# Patient Record
Sex: Female | Born: 1980 | Race: White | Hispanic: No | Marital: Married | Smoking: Never smoker
Health system: Southern US, Community
[De-identification: ages and names within clinical notes are randomized; demographics above are authoritative.]

## PROBLEM LIST (undated history)

## (undated) DIAGNOSIS — Z800 Family history of malignant neoplasm of digestive organs: Secondary | ICD-10-CM

## (undated) DIAGNOSIS — K620 Anal polyp: Secondary | ICD-10-CM

## (undated) DIAGNOSIS — Z8719 Personal history of other diseases of the digestive system: Secondary | ICD-10-CM

## (undated) DIAGNOSIS — B009 Herpesviral infection, unspecified: Secondary | ICD-10-CM

## (undated) DIAGNOSIS — E039 Hypothyroidism, unspecified: Secondary | ICD-10-CM

## (undated) DIAGNOSIS — K219 Gastro-esophageal reflux disease without esophagitis: Secondary | ICD-10-CM

## (undated) HISTORY — PX: COLONOSCOPY: SHX174

## (undated) HISTORY — DX: Hypothyroidism, unspecified: E03.9

## (undated) SURGERY — Surgical Case
Anesthesia: *Unknown

---

## 2002-09-16 HISTORY — PX: WISDOM TOOTH EXTRACTION: SHX21

## 2011-05-10 LAB — ANTIBODY SCREEN: Antibody Screen: NEGATIVE

## 2011-05-10 LAB — ABO/RH: RH Type: POSITIVE

## 2011-05-10 LAB — RUBELLA ANTIBODY, IGM: Rubella: IMMUNE

## 2011-05-10 LAB — CBC
HCT: 33 % — AB (ref 36–46)
Hemoglobin: 11.9 g/dL — AB (ref 12.0–16.0)
Platelets: 352 10*3/uL (ref 150–399)

## 2011-05-10 LAB — HIV ANTIBODY (ROUTINE TESTING W REFLEX): HIV: NONREACTIVE

## 2011-05-31 LAB — GC/CHLAMYDIA PROBE AMP, GENITAL: Chlamydia: NEGATIVE

## 2011-09-15 ENCOUNTER — Inpatient Hospital Stay (HOSPITAL_COMMUNITY): Admission: AD | Admit: 2011-09-15 | Payer: Self-pay | Source: Ambulatory Visit | Admitting: Obstetrics

## 2011-12-05 ENCOUNTER — Inpatient Hospital Stay (HOSPITAL_COMMUNITY)
Admission: AD | Admit: 2011-12-05 | Discharge: 2011-12-08 | DRG: 370 | Disposition: A | Discharge status: Home / Self Care | Payer: Federal, State, Local not specified - PPO | Source: Ambulatory Visit | Attending: Obstetrics | Admitting: Obstetrics

## 2011-12-05 ENCOUNTER — Encounter (HOSPITAL_COMMUNITY): Payer: Self-pay | Admitting: *Deleted

## 2011-12-05 DIAGNOSIS — O9903 Anemia complicating the puerperium: Secondary | ICD-10-CM | POA: Diagnosis not present

## 2011-12-05 DIAGNOSIS — O99892 Other specified diseases and conditions complicating childbirth: Secondary | ICD-10-CM | POA: Diagnosis present

## 2011-12-05 DIAGNOSIS — D62 Acute posthemorrhagic anemia: Secondary | ICD-10-CM | POA: Diagnosis not present

## 2011-12-05 DIAGNOSIS — O324XX0 Maternal care for high head at term, not applicable or unspecified: Secondary | ICD-10-CM | POA: Diagnosis present

## 2011-12-05 DIAGNOSIS — Z22330 Carrier of Group B streptococcus: Secondary | ICD-10-CM

## 2011-12-05 LAB — CBC
Hemoglobin: 11.6 g/dL — ABNORMAL LOW (ref 12.0–15.0)
MCH: 30.3 pg (ref 26.0–34.0)
Platelets: 277 10*3/uL (ref 150–400)
RBC: 3.83 MIL/uL — ABNORMAL LOW (ref 3.87–5.11)
WBC: 11.2 10*3/uL — ABNORMAL HIGH (ref 4.0–10.5)

## 2011-12-05 LAB — POCT FERN TEST: Fern Test: POSITIVE

## 2011-12-05 MED ORDER — PHENYLEPHRINE 40 MCG/ML (10ML) SYRINGE FOR IV PUSH (FOR BLOOD PRESSURE SUPPORT)
80.0000 ug | PREFILLED_SYRINGE | INTRAVENOUS | Status: DC | PRN
  Filled 2011-12-05: qty 5

## 2011-12-05 MED ORDER — EPHEDRINE 5 MG/ML INJ: 10.0000 mg | INTRAVENOUS | Status: DC | PRN

## 2011-12-05 MED ORDER — PENICILLIN G POTASSIUM 5000000 UNITS IJ SOLR
2.5000 10*6.[IU] | INTRAVENOUS | Status: DC
  Administered 2011-12-06 (×2): 2.5 10*6.[IU] via INTRAVENOUS
  Filled 2011-12-05 (×4): qty 2.5

## 2011-12-05 MED ORDER — EPHEDRINE 5 MG/ML INJ
10.0000 mg | INTRAVENOUS | Status: DC | PRN
  Filled 2011-12-05: qty 4

## 2011-12-05 MED ORDER — ONDANSETRON HCL 4 MG/2ML IJ SOLN: 4.0000 mg | Freq: Four times a day (QID) | INTRAMUSCULAR | Status: DC | PRN

## 2011-12-05 MED ORDER — OXYCODONE-ACETAMINOPHEN 5-325 MG PO TABS: 1.0000 | ORAL_TABLET | ORAL | Status: DC | PRN

## 2011-12-05 MED ORDER — IBUPROFEN 600 MG PO TABS: 600.0000 mg | ORAL_TABLET | Freq: Four times a day (QID) | ORAL | Status: DC | PRN

## 2011-12-05 MED ORDER — OXYTOCIN BOLUS FROM INFUSION
500.0000 mL | Freq: Once | INTRAVENOUS | Status: DC
  Filled 2011-12-05: qty 500
  Filled 2011-12-05: qty 1000

## 2011-12-05 MED ORDER — LACTATED RINGERS IV SOLN
500.0000 mL | Freq: Once | INTRAVENOUS | Status: AC
  Administered 2011-12-06 (×2): via INTRAVENOUS
  Administered 2011-12-06: 500 mL via INTRAVENOUS

## 2011-12-05 MED ORDER — OXYTOCIN 20 UNITS IN LACTATED RINGERS INFUSION - SIMPLE: 125.0000 mL/h | Freq: Once | INTRAVENOUS | Status: DC

## 2011-12-05 MED ORDER — FENTANYL 2.5 MCG/ML BUPIVACAINE 1/10 % EPIDURAL INFUSION (WH - ANES)
14.0000 mL/h | INTRAMUSCULAR | Status: DC
  Administered 2011-12-06 (×3): 14 mL/h via EPIDURAL
  Filled 2011-12-05 (×3): qty 60

## 2011-12-05 MED ORDER — ACETAMINOPHEN 325 MG PO TABS: 650.0000 mg | ORAL_TABLET | ORAL | Status: DC | PRN

## 2011-12-05 MED ORDER — PHENYLEPHRINE 40 MCG/ML (10ML) SYRINGE FOR IV PUSH (FOR BLOOD PRESSURE SUPPORT): 80.0000 ug | PREFILLED_SYRINGE | INTRAVENOUS | Status: DC | PRN

## 2011-12-05 MED ORDER — LACTATED RINGERS IV SOLN: 500.0000 mL | INTRAVENOUS | Status: DC | PRN

## 2011-12-05 MED ORDER — PENICILLIN G POTASSIUM 5000000 UNITS IJ SOLR
5.0000 10*6.[IU] | Freq: Once | INTRAVENOUS | Status: AC
  Administered 2011-12-05: 5 10*6.[IU] via INTRAVENOUS
  Filled 2011-12-05: qty 5

## 2011-12-05 MED ORDER — LACTATED RINGERS IV SOLN
INTRAVENOUS | Status: DC
  Administered 2011-12-05 – 2011-12-06 (×3): via INTRAVENOUS

## 2011-12-05 MED ORDER — DIPHENHYDRAMINE HCL 50 MG/ML IJ SOLN: 12.5000 mg | INTRAMUSCULAR | Status: DC | PRN

## 2011-12-05 MED ORDER — CITRIC ACID-SODIUM CITRATE 334-500 MG/5ML PO SOLN
30.0000 mL | ORAL | Status: DC | PRN
  Administered 2011-12-06: 30 mL via ORAL
  Filled 2011-12-05: qty 15

## 2011-12-05 MED ORDER — FLEET ENEMA 7-19 GM/118ML RE ENEM: 1.0000 | ENEMA | RECTAL | Status: DC | PRN

## 2011-12-05 MED ORDER — LIDOCAINE HCL (PF) 1 % IJ SOLN
30.0000 mL | INTRAMUSCULAR | Status: DC | PRN
  Filled 2011-12-05: qty 30

## 2011-12-05 NOTE — Progress Notes (Deleted)
Pt appears distracted by the telephone. Pt instructed to keep appointment already scheduled. Pt denies pain at this time.

## 2011-12-05 NOTE — MAU Note (Signed)
Pt G1 at 38.4wks, gush of clear fluid at 2030.  GBS +.  Pt seen in the office 3/20 SVE 4cm.

## 2011-12-05 NOTE — Progress Notes (Signed)
Dr. Ernestina Penna notified patient SROM at 2030 clear fluid. SVE 4/80/-1 ctx every 4-5 minutes apart. GBS pos NKDA. Orders to admit with Penicillin for GBS. Epidural prn

## 2011-12-06 ENCOUNTER — Encounter (HOSPITAL_COMMUNITY)
Admission: AD | Disposition: A | Discharge status: Home / Self Care | Payer: Self-pay | Source: Ambulatory Visit | Attending: Obstetrics

## 2011-12-06 ENCOUNTER — Encounter (HOSPITAL_COMMUNITY): Payer: Self-pay | Admitting: *Deleted

## 2011-12-06 ENCOUNTER — Encounter (HOSPITAL_COMMUNITY): Payer: Self-pay | Admitting: Anesthesiology

## 2011-12-06 ENCOUNTER — Inpatient Hospital Stay (HOSPITAL_COMMUNITY): Payer: Federal, State, Local not specified - PPO | Admitting: Anesthesiology

## 2011-12-06 LAB — ABO/RH: ABO/RH(D): O POS

## 2011-12-06 LAB — RPR: RPR Ser Ql: NONREACTIVE

## 2011-12-06 SURGERY — Surgical Case
Anesthesia: Epidural

## 2011-12-06 MED ORDER — CEFAZOLIN SODIUM 1-5 GM-% IV SOLN
INTRAVENOUS | Status: AC
  Filled 2011-12-06: qty 50

## 2011-12-06 MED ORDER — DIBUCAINE 1 % RE OINT: 1.0000 "application " | TOPICAL_OINTMENT | RECTAL | Status: DC | PRN

## 2011-12-06 MED ORDER — FENTANYL CITRATE 0.05 MG/ML IJ SOLN
25.0000 ug | INTRAMUSCULAR | Status: DC | PRN
  Administered 2011-12-06: 50 ug via INTRAVENOUS

## 2011-12-06 MED ORDER — CEFAZOLIN SODIUM 1-5 GM-% IV SOLN
INTRAVENOUS | Status: DC | PRN
  Administered 2011-12-06: 1 g via INTRAVENOUS

## 2011-12-06 MED ORDER — KETOROLAC TROMETHAMINE 30 MG/ML IJ SOLN: 30.0000 mg | Freq: Four times a day (QID) | INTRAMUSCULAR | Status: AC | PRN

## 2011-12-06 MED ORDER — IBUPROFEN 600 MG PO TABS: 600.0000 mg | ORAL_TABLET | Freq: Four times a day (QID) | ORAL | Status: DC | PRN

## 2011-12-06 MED ORDER — MEPERIDINE HCL 25 MG/ML IJ SOLN
INTRAMUSCULAR | Status: AC
  Filled 2011-12-06: qty 1

## 2011-12-06 MED ORDER — SODIUM BICARBONATE 8.4 % IV SOLN
INTRAVENOUS | Status: DC | PRN
  Administered 2011-12-06: 5 mL via EPIDURAL

## 2011-12-06 MED ORDER — TERBUTALINE SULFATE 1 MG/ML IJ SOLN: 0.2500 mg | Freq: Once | INTRAMUSCULAR | Status: DC | PRN

## 2011-12-06 MED ORDER — ZOLPIDEM TARTRATE 5 MG PO TABS: 5.0000 mg | ORAL_TABLET | Freq: Every evening | ORAL | Status: DC | PRN

## 2011-12-06 MED ORDER — FENTANYL CITRATE 0.05 MG/ML IJ SOLN
INTRAMUSCULAR | Status: AC
  Administered 2011-12-06: 50 ug via INTRAVENOUS
  Filled 2011-12-06: qty 2

## 2011-12-06 MED ORDER — OXYTOCIN 10 UNIT/ML IJ SOLN
INTRAMUSCULAR | Status: DC | PRN
  Administered 2011-12-06: 20 [IU]
  Administered 2011-12-06: 20 [IU] via INTRAMUSCULAR

## 2011-12-06 MED ORDER — MEPERIDINE HCL 25 MG/ML IJ SOLN
INTRAMUSCULAR | Status: DC | PRN
  Administered 2011-12-06 (×2): 12.5 mg via INTRAVENOUS

## 2011-12-06 MED ORDER — DIPHENHYDRAMINE HCL 25 MG PO CAPS
25.0000 mg | ORAL_CAPSULE | ORAL | Status: DC | PRN
  Administered 2011-12-06: 25 mg via ORAL
  Filled 2011-12-06: qty 1

## 2011-12-06 MED ORDER — LACTATED RINGERS IV SOLN: INTRAVENOUS | Status: DC

## 2011-12-06 MED ORDER — LANOLIN HYDROUS EX OINT: 1.0000 "application " | TOPICAL_OINTMENT | CUTANEOUS | Status: DC | PRN

## 2011-12-06 MED ORDER — MEPERIDINE HCL 25 MG/ML IJ SOLN: 6.2500 mg | INTRAMUSCULAR | Status: DC | PRN

## 2011-12-06 MED ORDER — ONDANSETRON HCL 4 MG/2ML IJ SOLN
INTRAMUSCULAR | Status: DC | PRN
  Administered 2011-12-06: 4 mg via INTRAVENOUS

## 2011-12-06 MED ORDER — SCOPOLAMINE 1 MG/3DAYS TD PT72
1.0000 | MEDICATED_PATCH | Freq: Once | TRANSDERMAL | Status: DC
  Filled 2011-12-06: qty 1

## 2011-12-06 MED ORDER — NALBUPHINE HCL 10 MG/ML IJ SOLN
5.0000 mg | INTRAMUSCULAR | Status: DC | PRN
  Filled 2011-12-06: qty 1

## 2011-12-06 MED ORDER — LIDOCAINE-EPINEPHRINE (PF) 2 %-1:200000 IJ SOLN
INTRAMUSCULAR | Status: AC
  Filled 2011-12-06: qty 20

## 2011-12-06 MED ORDER — SODIUM CHLORIDE 0.9 % IJ SOLN: 3.0000 mL | INTRAMUSCULAR | Status: DC | PRN

## 2011-12-06 MED ORDER — MORPHINE SULFATE 0.5 MG/ML IJ SOLN
INTRAMUSCULAR | Status: AC
  Filled 2011-12-06: qty 10

## 2011-12-06 MED ORDER — OXYTOCIN 20 UNITS IN LACTATED RINGERS INFUSION - SIMPLE
1.0000 m[IU]/min | INTRAVENOUS | Status: DC
  Administered 2011-12-06: 2 m[IU]/min via INTRAVENOUS

## 2011-12-06 MED ORDER — SIMETHICONE 80 MG PO CHEW
80.0000 mg | CHEWABLE_TABLET | Freq: Three times a day (TID) | ORAL | Status: DC
  Administered 2011-12-06 – 2011-12-08 (×8): 80 mg via ORAL

## 2011-12-06 MED ORDER — ONDANSETRON HCL 4 MG/2ML IJ SOLN: 4.0000 mg | INTRAMUSCULAR | Status: DC | PRN

## 2011-12-06 MED ORDER — OXYTOCIN 10 UNIT/ML IJ SOLN
INTRAMUSCULAR | Status: AC
  Filled 2011-12-06: qty 4

## 2011-12-06 MED ORDER — TETANUS-DIPHTH-ACELL PERTUSSIS 5-2.5-18.5 LF-MCG/0.5 IM SUSP: 0.5000 mL | Freq: Once | INTRAMUSCULAR | Status: DC

## 2011-12-06 MED ORDER — KETOROLAC TROMETHAMINE 30 MG/ML IJ SOLN
INTRAMUSCULAR | Status: AC
  Administered 2011-12-06: 30 mg via INTRAVENOUS
  Filled 2011-12-06: qty 1

## 2011-12-06 MED ORDER — MENTHOL 3 MG MT LOZG: 1.0000 | LOZENGE | OROMUCOSAL | Status: DC | PRN

## 2011-12-06 MED ORDER — OXYTOCIN 20 UNITS IN LACTATED RINGERS INFUSION - SIMPLE: 125.0000 mL/h | INTRAVENOUS | Status: AC

## 2011-12-06 MED ORDER — ONDANSETRON HCL 4 MG PO TABS: 4.0000 mg | ORAL_TABLET | ORAL | Status: DC | PRN

## 2011-12-06 MED ORDER — DIPHENHYDRAMINE HCL 50 MG/ML IJ SOLN: 12.5000 mg | INTRAMUSCULAR | Status: DC | PRN

## 2011-12-06 MED ORDER — IBUPROFEN 600 MG PO TABS
600.0000 mg | ORAL_TABLET | Freq: Four times a day (QID) | ORAL | Status: DC
  Administered 2011-12-06 – 2011-12-08 (×8): 600 mg via ORAL
  Filled 2011-12-06 (×8): qty 1

## 2011-12-06 MED ORDER — ONDANSETRON HCL 4 MG/2ML IJ SOLN: 4.0000 mg | Freq: Three times a day (TID) | INTRAMUSCULAR | Status: DC | PRN

## 2011-12-06 MED ORDER — SIMETHICONE 80 MG PO CHEW: 80.0000 mg | CHEWABLE_TABLET | ORAL | Status: DC | PRN

## 2011-12-06 MED ORDER — METOCLOPRAMIDE HCL 5 MG/ML IJ SOLN: 10.0000 mg | Freq: Once | INTRAMUSCULAR | Status: DC | PRN

## 2011-12-06 MED ORDER — WITCH HAZEL-GLYCERIN EX PADS: 1.0000 "application " | MEDICATED_PAD | CUTANEOUS | Status: DC | PRN

## 2011-12-06 MED ORDER — DIPHENHYDRAMINE HCL 25 MG PO CAPS: 25.0000 mg | ORAL_CAPSULE | Freq: Four times a day (QID) | ORAL | Status: DC | PRN

## 2011-12-06 MED ORDER — OXYCODONE-ACETAMINOPHEN 5-325 MG PO TABS: 1.0000 | ORAL_TABLET | ORAL | Status: DC | PRN

## 2011-12-06 MED ORDER — SODIUM CHLORIDE 0.9 % IV SOLN: 1.0000 ug/kg/h | INTRAVENOUS | Status: DC | PRN

## 2011-12-06 MED ORDER — PRENATAL MULTIVITAMIN CH
1.0000 | ORAL_TABLET | Freq: Every day | ORAL | Status: DC
  Administered 2011-12-07 – 2011-12-08 (×2): 1 via ORAL
  Filled 2011-12-06 (×2): qty 1

## 2011-12-06 MED ORDER — ONDANSETRON HCL 4 MG/2ML IJ SOLN
INTRAMUSCULAR | Status: AC
  Filled 2011-12-06: qty 2

## 2011-12-06 MED ORDER — KETOROLAC TROMETHAMINE 30 MG/ML IJ SOLN
30.0000 mg | Freq: Four times a day (QID) | INTRAMUSCULAR | Status: AC | PRN
  Administered 2011-12-06: 30 mg via INTRAVENOUS

## 2011-12-06 MED ORDER — NALOXONE HCL 0.4 MG/ML IJ SOLN: 0.4000 mg | INTRAMUSCULAR | Status: DC | PRN

## 2011-12-06 MED ORDER — SODIUM BICARBONATE 8.4 % IV SOLN
INTRAVENOUS | Status: AC
  Filled 2011-12-06: qty 50

## 2011-12-06 MED ORDER — LIDOCAINE HCL (PF) 1 % IJ SOLN
INTRAMUSCULAR | Status: DC | PRN
  Administered 2011-12-06 (×3): 4 mL

## 2011-12-06 MED ORDER — DIPHENHYDRAMINE HCL 50 MG/ML IJ SOLN: 25.0000 mg | INTRAMUSCULAR | Status: DC | PRN

## 2011-12-06 MED ORDER — MORPHINE SULFATE (PF) 0.5 MG/ML IJ SOLN
INTRAMUSCULAR | Status: DC | PRN
  Administered 2011-12-06: 1 mg via INTRAVENOUS
  Administered 2011-12-06: 4 mg via EPIDURAL

## 2011-12-06 MED ORDER — SENNOSIDES-DOCUSATE SODIUM 8.6-50 MG PO TABS
2.0000 | ORAL_TABLET | Freq: Every day | ORAL | Status: DC
  Administered 2011-12-06 – 2011-12-08 (×2): 2 via ORAL

## 2011-12-06 MED ORDER — METOCLOPRAMIDE HCL 5 MG/ML IJ SOLN: 10.0000 mg | Freq: Three times a day (TID) | INTRAMUSCULAR | Status: DC | PRN

## 2011-12-06 SURGICAL SUPPLY — 29 items
CHLORAPREP W/TINT 26ML (MISCELLANEOUS) ×2 IMPLANT
CLOTH BEACON ORANGE TIMEOUT ST (SAFETY) ×2 IMPLANT
CONTAINER PREFILL 10% NBF 15ML (MISCELLANEOUS) IMPLANT
ELECT REM PT RETURN 9FT ADLT (ELECTROSURGICAL) ×2
ELECTRODE REM PT RTRN 9FT ADLT (ELECTROSURGICAL) ×1 IMPLANT
EXTRACTOR VACUUM KIWI (MISCELLANEOUS) IMPLANT
EXTRACTOR VACUUM M CUP 4 TUBE (SUCTIONS) IMPLANT
GLOVE BIO SURGEON STRL SZ 6.5 (GLOVE) ×2 IMPLANT
GLOVE BIOGEL PI IND STRL 7.0 (GLOVE) ×2 IMPLANT
GLOVE BIOGEL PI INDICATOR 7.0 (GLOVE) ×2
GOWN PREVENTION PLUS LG XLONG (DISPOSABLE) ×6 IMPLANT
KIT ABG SYR 3ML LUER SLIP (SYRINGE) IMPLANT
NEEDLE HYPO 25X5/8 SAFETYGLIDE (NEEDLE) IMPLANT
NS IRRIG 1000ML POUR BTL (IV SOLUTION) ×2 IMPLANT
PACK C SECTION WH (CUSTOM PROCEDURE TRAY) ×2 IMPLANT
SLEEVE SCD COMPRESS KNEE MED (MISCELLANEOUS) IMPLANT
STAPLER VISISTAT 35W (STAPLE) IMPLANT
STRIP CLOSURE SKIN 1/2X4 (GAUZE/BANDAGES/DRESSINGS) IMPLANT
SUT MON AB 4-0 PS1 27 (SUTURE) IMPLANT
SUT PLAIN 0 NONE (SUTURE) IMPLANT
SUT PLAIN 2 0 XLH (SUTURE) IMPLANT
SUT VIC AB 0 CT1 36 (SUTURE) ×4 IMPLANT
SUT VIC AB 0 CTX 36 (SUTURE) ×3
SUT VIC AB 0 CTX36XBRD ANBCTRL (SUTURE) ×3 IMPLANT
SUT VIC AB 2-0 CT1 27 (SUTURE) ×1
SUT VIC AB 2-0 CT1 TAPERPNT 27 (SUTURE) ×1 IMPLANT
TOWEL OR 17X24 6PK STRL BLUE (TOWEL DISPOSABLE) ×4 IMPLANT
TRAY FOLEY CATH 14FR (SET/KITS/TRAYS/PACK) IMPLANT
WATER STERILE IRR 1000ML POUR (IV SOLUTION) ×2 IMPLANT

## 2011-12-06 NOTE — Transfer of Care (Signed)
Immediate Anesthesia Transfer of Care Note  Patient: Molly Castro  Procedure(s) Performed: Procedure(s) (LRB): CESAREAN SECTION (N/A)  Patient Location: PACU  Anesthesia Type: Epidural  Level of Consciousness: awake, alert  and oriented  Airway & Oxygen Therapy: Patient Spontanous Breathing  Post-op Assessment: Report given to PACU RN  Post vital signs: stable  Complications: No apparent anesthesia complications

## 2011-12-06 NOTE — H&P (Signed)
Molly Castro is a 31 y.o. G1P0 at [redacted]w[redacted]d presenting for SROM. Pt notes no contractions until after admission. Started feeling ctx about MN and had now received epidural . Good fetal movement, No vaginal bleeding, started leaking fluid about 8pm.  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 No pertinent past medical history    Past Surgical History  Procedure Date  . Wisdom tooth extraction 2004   Family History: family history is not on file. Social History:  reports that she has never smoked. She has never used smokeless tobacco. She reports that she does not drink alcohol. Her drug history not on file.  Review of Systems - Negative except ROM, ctx   Dilation: 5 Effacement (%): 100 Station: 0 Exam by:: Ernestina Penna, MD Blood pressure 131/81, pulse 78, temperature 98.2 F (36.8 C), temperature source Oral, resp. rate 20, height 5\' 5"  (1.651 m), weight 69.037 kg (152 lb 3.2 oz), SpO2 97.00%.  Physical Exam:  Gen: well appearing, no distress Back: no CVAT Abd: gravid, NT, no RUQ pain EFW 7# LE: no edema, equal bilaterally, non-tender Toco: q 2-4 min, coupling noted FH: baseline 150s, accelerations present, no deceleratons, 10 beat variability  Prenatal labs: ABO, Rh: O/Positive/-- (08/24 0000) Antibody: Negative (08/24 0000) Rubella:  immune RPR: Nonreactive (08/24 0000)  HBsAg: Negative (08/24 0000)  HIV: Non-reactive (08/24 0000)  GBS: Positive (03/06 0000)  1 hr Glucola 112  Genetic screening nl NT, nl AFP Anatomy US EIF x 2   Assessment/Plan: 31 y.o. G1P0 at [redacted]w[redacted]d - Now in active labor following SROM. Will cont to manage expectantly, start pitocin if cvx does not cont to change - GBS pos.  PCN  - Reactive fetal testing   Maxximus Gotay A. 12/06/2011, 1:00 AM

## 2011-12-06 NOTE — Progress Notes (Signed)
Patient positioned for pushing. FOB offered stationary chair for pushing/delivery.

## 2011-12-06 NOTE — Progress Notes (Signed)
S: Doing well, no complaints, pain controlled by epidural though able to feel ctx and also constant rectal pressure. Now pushing 3.5 hrs  O: BP 139/79  Pulse 118  Temp(Src) 98.5 F (36.9 C) (Oral)  Resp 20  Ht 5\' 5"  (1.651 m)  Wt 69.037 kg (152 lb 3.2 oz)  BMI 25.33 kg/m2  SpO2 98%   FHT:  FHR: 150 bpm, variability: moderate,  accelerations:  Present,  decelerations:  Absent UC:   Coupling q 3-5 min, pitocin started 30 min ago SVE:   Dilation: 10 Effacement (%): 100 Station: +3 Exam by:: Isac Sarna, RN Has been fully dilated since 4:30 am, pushed 3.5 hr w/ no significant descent despite pushing, increase molding/ caput. asynclitic  A / P:  31 y.o.  Obstetric History   G1   P0   T0   P0   A0   TAB0   SAB0   E0   M0   L0    at [redacted]w[redacted]d Arrest of decent  Fetal Wellbeing:  Category I Pain Control:  Epidural  Anticipated MOD:  c/s now. R/B d/w pt about c/s vs continued expectant management. pt agrees to c/s, aware of increased risks given baby so low in pelvis which increases bleeding risks.   Molly Castro A. 12/06/2011, 8:44 AM

## 2011-12-06 NOTE — Anesthesia Postprocedure Evaluation (Signed)
Anesthesia Post Note  Patient: Molly Castro  Procedure(s) Performed: Procedure(s) (LRB): CESAREAN SECTION (N/A)  Anesthesia type: Epidural  Patient location: PACU  Post pain: Pain level controlled  Post assessment: Post-op Vital signs reviewed  Last Vitals:  Filed Vitals:   12/06/11 1030  BP: 122/75  Pulse: 77  Temp:   Resp: 18    Post vital signs: Reviewed  Level of consciousness: awake  Complications: No apparent anesthesia complications

## 2011-12-06 NOTE — Progress Notes (Signed)
UR chart review completed.  

## 2011-12-06 NOTE — Anesthesia Preprocedure Evaluation (Addendum)
Anesthesia Evaluation  Patient identified by MRN, date of birth, ID band Patient awake    Reviewed: Allergy & Precautions, H&P , NPO status , Patient's Chart, lab work & pertinent test results, reviewed documented beta blocker date and time   History of Anesthesia Complications Negative for: history of anesthetic complications  Airway Mallampati: III TM Distance: >3 FB Neck ROM: full    Dental  (+) Teeth Intact   Pulmonary neg pulmonary ROS,  breath sounds clear to auscultation        Cardiovascular negative cardio ROS  Rhythm:regular Rate:Normal     Neuro/Psych negative neurological ROS  negative psych ROS   GI/Hepatic negative GI ROS, Neg liver ROS,   Endo/Other  negative endocrine ROS  Renal/GU negative Renal ROS     Musculoskeletal   Abdominal   Peds  Hematology negative hematology ROS (+)   Anesthesia Other Findings   Reproductive/Obstetrics (+) Pregnancy                           Anesthesia Physical Anesthesia Plan  ASA: II and Emergent  Anesthesia Plan: Epidural   Post-op Pain Management:    Induction:   Airway Management Planned:   Additional Equipment:   Intra-op Plan:   Post-operative Plan:   Informed Consent: I have reviewed the patients History and Physical, chart, labs and discussed the procedure including the risks, benefits and alternatives for the proposed anesthesia with the patient or authorized representative who has indicated his/her understanding and acceptance.     Plan Discussed with: CRNA, Anesthesiologist and Surgeon  Anesthesia Plan Comments:        Anesthesia Quick Evaluation

## 2011-12-06 NOTE — Progress Notes (Signed)
Rodman Pickle, MD, at bedside for epidural placement. Father of baby offered a stationary chair during procedure. Father of baby declines.

## 2011-12-06 NOTE — Anesthesia Procedure Notes (Signed)
Epidural Patient location during procedure: OB Start time: 12/06/2011 12:28 AM Reason for block: procedure for pain  Staffing Performed by: anesthesiologist   Preanesthetic Checklist Completed: patient identified, site marked, surgical consent, pre-op evaluation, timeout performed, IV checked, risks and benefits discussed and monitors and equipment checked  Epidural Patient position: sitting Prep: site prepped and draped and DuraPrep Patient monitoring: continuous pulse ox and blood pressure Approach: midline Injection technique: LOR air  Needle:  Needle type: Tuohy  Needle gauge: 17 G Needle length: 9 cm Catheter type: closed end flexible Catheter size: 19 Gauge Test dose: negative  Assessment Events: blood not aspirated, injection not painful, no injection resistance, negative IV test and no paresthesia  Additional Notes Discussed risk of headache, infection, bleeding, nerve injury and failed or incomplete block.  Patient voices understanding and wishes to proceed.

## 2011-12-06 NOTE — Anesthesia Postprocedure Evaluation (Signed)
  Anesthesia Post-op Note  Patient: Molly Castro  Procedure(s) Performed: Procedure(s) (LRB): CESAREAN SECTION (N/A)  Patient Location: Mother/Baby  Anesthesia Type: Epidural  Level of Consciousness: alert  and oriented  Airway and Oxygen Therapy: Patient Spontanous Breathing  Post-op Pain: mild  Post-op Assessment: Patient's Cardiovascular Status Stable and Respiratory Function Stable  Post-op Vital Signs: stable  Complications: No apparent anesthesia complications

## 2011-12-06 NOTE — Brief Op Note (Signed)
12/05/2011 - 12/06/2011  9:51 AM  PATIENT:  Molly Castro  31 y.o. female  PRE-OPERATIVE DIAGNOSIS:  Arrest of Descent  POST-OPERATIVE DIAGNOSIS:  Arrest of Descent  PROCEDURE:  Procedure(s) (LRB): CESAREAN SECTION (N/A)  SURGEON:  Surgeon(s) and Role:    Tresa Endo A. Ernestina Penna, MD - Primary  PHYSICIAN ASSISTANT:   ASSISTANTS: Marlinda Mike   ANESTHESIA:   epidural  EBL:  Total I/O In: 1000 [I.V.:1000] Out: 600 [Urine:200; Blood:400]  BLOOD ADMINISTERED:none  DRAINS: Urinary Catheter (Foley)   LOCAL MEDICATIONS USED:  NONE  SPECIMEN:  Source of Specimen:  placenta  DISPOSITION OF SPECIMEN:  L&D  COUNTS:  YES  TOURNIQUET:  * No tourniquets in log *  DICTATION: .Note written in EPIC  PLAN OF CARE: Admit to inpatient   PATIENT DISPOSITION:  PACU - hemodynamically stable.   Delay start of Pharmacological VTE agent (>24hrs) due to surgical blood loss or risk of bleeding: yes

## 2011-12-06 NOTE — Op Note (Signed)
Preoperative diagnosis: arrest of descent  Postop diagnosis:  same Procedure: PCS, 2 layer closure, LTCS Surgeon: Noland Fordyce, MD Assistant:. Fredric Mare Anesthesia: epidural Findings:  @BABYSEXEBC @ infant,  APGAR (1 MIN): 9   APGAR (5 MINS): 9   APGAR (10 MINS):    nl uterus, tubes, ovaries, clear amniotic fluid, asynclitic presentation EBL: 400 cc Antibiotics:  1g Ancef  Complications: none  Indications: This is a 31 y.o. year-old, G1  At [redacted]w[redacted]d admitted for ROM. Pt entered active labor about 4 hr after ROM and progressed normally to fully dilated. Then had 40 min of passive descent and began pushing from the +2 station. After 3.5 hrs of pushing, increased caput and molding noted but vtx w. No descent over 3.5 hrs. Decision for c/s made. Risks benefits and alternatives of the procedure were discussed with the patient who agreed to proceed  Procedure:  After informed consent was obtained the patient was taken to the operating room where epidural anesthesia was found to be adequate.  She was prepped and draped in the normal sterile fashion in dorsal supine position with a leftward tilt.  A foley catheter was inserted sterilely into the bladder prior in the labor room. Gloves were changes and attention was turned to the patient's abdomen. A Pfannenstiel skin incision was made 2 cm above the pubic symphysis in the midline with the scalpel.  Dissection was carried down with the Bovie cautery until the fascia was reached. The fascia was incised in the midline. The incision was extended laterally with the Mayo scissors. The inferior aspect of the fascial incision was grasped with the Coker clamps, elevated up and the underlying rectus muscles were dissected off sharply. The superior aspect of the fascial incision was grasped with the Coker clamps elevated up and the underlying rectus muscles were dissected off sharply. The prymadilis muscles were separated sharply. The peritoneum was entered bluntly. The  peritoneal incision was extended superiorly and inferiorly with good visualization of the bladder. The bladder blade was inserted, the vesicouterine peritoneum was identified grasped with the pickups and entered sharply. The bladder flap was created digitally the bladder blade was reinserted. Palpation was done to assess the fetal position and the location of the uterine vessels. The CNM assist then went below to push the fetal vertex upward. Once this was done, the lower segment of the uterus was incised sharply with the scalpel and extended superiorly and laterally with the bandage scissors. The infant also was grasped brought to the incision but began to rotate to the transverse position. With continued fundal pressure I was able to keep the vtx at the incision and placed a vacuum on the vtx. With one pull the vtx was delivered. The remainder of the baby delivers without complication. The cord was clamped and cut. The infant was handed off to the waiting pediatrician. The placenta was expressed. The uterus was exteriorized. The uterus was cleared of all clots and debris. A midline inferior extension was noted, this was repaired primarily in 2 layers. The uterine incision was repaired with 0 Vicryl in a running locked fashion.  A second layer of the same suture was used in an imbricating fashion to obtain excellent hemostasis.  The uterus was then returned to the abdomen, the gutters were cleared of all clots and debris. The uterine incision was reinspected and found to be hemostatic. The peritoneum was grasped and closed with 2-0 Vicryl in a running fashion. The cut muscle edges and the underside of the fascia were  inspected and found to be hemostatic. The fascia was closed with 0 Vicryl in two halves. The subcutaneous tissue was irrigated. Scarpa's layer was closed with a 2-0 plain gut suture. The skin was closed with a 4-0 Monocryl in a single layer. The patient tolerated the procedure well. Sponge lap and  needle counts were correct x3 and patient was taken to the recovery room in a stable condition.  Yuvin Bussiere A. 12/06/2011 9:53 AM

## 2011-12-06 NOTE — Progress Notes (Signed)
S: Doing well, no complaints, pain well controlled with epidural. Pt starting to feel some pressure. Moving legs well  O: BP 117/67  Pulse 77  Temp(Src) 98 F (36.7 C) (Oral)  Resp 18  Ht 5\' 5"  (1.651 m)  Wt 69.037 kg (152 lb 3.2 oz)  BMI 25.33 kg/m2  SpO2 98%   FHT:  FHR: 150 bpm, variability: moderate,  accelerations:  Present,  decelerations:  Absent UC:   Couplets every 4-7 min SVE:   Dilation: 10 Effacement (%): 100 Station: +2 Exam by:: Ernestina Penna, MD   A / P:  31 y.o.  Obstetric History   G1   P0   T0   P0   A0   TAB0   SAB0   E0   M0   L0    at [redacted]w[redacted]d Spontaneous labor, progressing normally  Fetal Wellbeing:  Category I Pain Control:  Epidural  Anticipated MOD:  NSVD - ctx pattern  irreg but cvx progressing - will start pushing - reactive fetal testing  Sheffield Hawker A. 12/06/2011, 4:31 AM

## 2011-12-07 LAB — CBC
HCT: 29.9 % — ABNORMAL LOW (ref 36.0–46.0)
Hemoglobin: 9.8 g/dL — ABNORMAL LOW (ref 12.0–15.0)
MCV: 91.4 fL (ref 78.0–100.0)
RBC: 3.27 MIL/uL — ABNORMAL LOW (ref 3.87–5.11)
RDW: 13.5 % (ref 11.5–15.5)
WBC: 17.4 10*3/uL — ABNORMAL HIGH (ref 4.0–10.5)

## 2011-12-07 NOTE — Progress Notes (Signed)
Patient ID: AARICA WAX, female   DOB: 1980/10/13, 31 y.o.   MRN: [id number]  POSTOPERATIVE DAY # 1 S/P cesarean section   S:         Reports feeling well / no pain / no concerns             Tolerating po intake / no  nausea / no  vomiting / no flatus / no  BM             Bleeding is light             Pain controlled              Up ad lib / ambulatory  Newborn breast-feeding  / Circumcision planned   O:  A & O x 3 NAD             VS: Blood pressure 108/69, pulse 91, temperature 98.3 F (36.8 C), temperature source Oral, resp. rate 20, height 5\' 5"  (1.651 m), weight 69.037 kg (152 lb 3.2 oz), SpO2 98.00%, unknown if currently breastfeeding.  LABS: WBC/Hgb/Hct/Plts:  17.4/9.8/29.9/210 (03/23 0500)   I&O:   -1600  Lungs: Clear and unlabored  Heart: regular rate and rhythm / no mumurs  Abdomen: soft, non-tender, non-distended, hypoactive BS             Fundus: firm, non-tender, U-1             Dressing intact             Perineum: no edema  Lochia: light  Extremities: no edema, no calf pain or tenderness, negative Homans  A:        POD # 1 S/P cesarean section            Mild ABL anemia - stable status  P:        Routine postoperative care              Advance activity             D/C dressing today in shower             Consider early discharge     Marlinda Mike 12/07/2011, 11:33 AM

## 2011-12-08 MED ORDER — IBUPROFEN 800 MG PO TABS: 800.0000 mg | ORAL_TABLET | Freq: Three times a day (TID) | ORAL | Status: AC | PRN

## 2011-12-08 MED ORDER — OXYCODONE-ACETAMINOPHEN 5-325 MG PO TABS: 1.0000 | ORAL_TABLET | ORAL | Status: AC | PRN

## 2011-12-08 NOTE — Progress Notes (Signed)
Patient ID: JUSTICE AGUIRRE, female   DOB: July 25, 1981, 31 y.o.   MRN: [id number]  POSTOPERATIVE DAY # 2 S/P cesarean section   S:         Reports feeling well - desires early discharge today             Tolerating po intake / no nausea / no  vomiting / + flatus / no BM             Bleeding is spotting             Pain controlled with motrin and percocet             Up ad lib / ambulatory  Newborn breast feeding  / Circumcision done   O:  A & O x 3 NAD             VS: Blood pressure 112/74, pulse 86, temperature 98.1 F (36.7 C), temperature source Axillary, resp. rate 18, height 5\' 5"  (1.651 m), weight 69.037 kg (152 lb 3.2 oz), SpO2 98.00%, unknown if currently breastfeeding.  Lungs: Clear and unlabored  Heart: regular rate and rhythm / no mumurs  Abdomen: soft, non-tender, non-distended, active BS             Fundus: firm, non-tender, U-1             Dressing OFF              Incision:  approximated with subcuticular / no erythema / no ecchymosis / no drainage  Perineum: no edema  Lochia: ligth  Extremities: no edema, no calf pain or tenderness, negative Homans  A:        POD # 2 S/P cesarean section              P:        Routine postoperative care              Discharge home     Marlinda Mike 12/08/2011, 10:50 AM

## 2011-12-08 NOTE — Discharge Summary (Signed)
POSTOPERATIVE DISCHARGE SUMMARY:  Patient ID: AARVI STOTTS MRN: [id number] DOB/AGE: [birth year] 30 y.o.  Admit date: 12/05/2011 Discharge date:  12/08/2011  Admission Diagnoses: Active labor    Discharge Diagnoses:   Term Pregnancy-delivered and POD 2 s/p cesarean section  Prenatal history: G1P1001   EDC : 12/15/2011, Alternate EDD Entry  Prenatal care at [facility]  Primary provider : Dr Ernestina Penna Prenatal course uncomplicated  Prenatal Labs: ABO, Rh: O (08/24 0000)  Antibody: NEG (03/22 0837) Rubella: Immune (08/24 0000)  RPR: NON REACTIVE (03/21 2225)  HBsAg: Negative (08/24 0000)  HIV: Non-reactive (08/24 0000)  GBS: Positive (03/06 0000)  1 hr Glucola : nl   Medical / Surgical History :  Past medical history:  Past Medical History  Diagnosis Date  . No pertinent past medical history     Past surgical history:  Past Surgical History  Procedure Date  . Wisdom tooth extraction 2004    Family History: History reviewed. No pertinent family history.  Social History:  reports that she has never smoked. She has never used smokeless tobacco. She reports that she does not drink alcohol. Her drug history not on file.   Allergies: Review of patient's allergies indicates no known allergies.    Current Medications at time of admission:  Prior to Admission medications   Medication Sig Start Date End Date Taking? Authorizing Provider  calcium carbonate (TUMS - DOSED IN MG ELEMENTAL CALCIUM) 500 MG chewable tablet   Chew 1 tablet by mouth daily as needed. For heartburn   Yes Historical Provider, MD  folic acid (FOLVITE) 400 MCG tablet Take 400 mcg by mouth every morning.   Yes Historical Provider, MD                Prenatal Vit-Fe Fumarate-FA (PRENATAL MULTIVITAMIN) TABS Take 1 tablet by mouth every morning.   Yes Historical Provider, MD    Intrapartum Course: Admitted in labor with SROM - epidural anesthesia for pain management. Progression to  complete dilation with arrest for fetal descent after 3.5 hours of active second stage.  Procedures: Cesarean section delivery of female newborn by Dr Ernestina Penna  See operative report for further details  Postoperative / postpartum course: uneventful - discharged POD 2  Physical Exam:   Within normal limits / no dependent edema  VSS: Blood pressure 112/74, pulse 86, temperature 98.1 F (36.7 C), temperature source Axillary, resp. rate 18, height 5\' 5"  (1.651 m), weight 69.037 kg (152 lb 3.2 oz), SpO2 98.00%, unknown if currently breastfeeding.  LABS:   preop hgb 11.6  / postop hgb 9.8  Incision:  approximated with subcuticular suture with steri-strips / no erythema / no ecchymosis / no drainage  Discharge Instructions:  Discharged Condition: stable Activity: pelvic rest and postoperative activity restriction x 2 weeks Diet: routine Medications: listed as following: Medication List  As of 12/08/2011 10:55 AM   TAKE these medications         calcium carbonate 500 MG chewable tablet   Commonly known as: TUMS - dosed in mg elemental calcium   Chew 1 tablet by mouth daily as needed. For heartburn      folic acid 400 MCG tablet   Commonly known as: FOLVITE   Take 400 mcg by mouth every morning.      ibuprofen 800 MG tablet   Commonly known as: ADVIL,MOTRIN   Take 1 tablet (800 mg total) by mouth every 8 (eight) hours as needed for pain.  oxyCODONE-acetaminophen 5-325 MG per tablet   Commonly known as: PERCOCET   Take 1 tablet by mouth every 4 (four) hours as needed (moderate - severe pain).      prenatal multivitamin Tabs   Take 1 tablet by mouth every morning.           Condition: stable Postpartum Instructions: refer to practice specific booklet Discharge to: home Disposition: Final discharge disposition not confirmed Follow up :  Follow-up Information    Follow up with [facility] A., MD. Schedule an appointment as soon as possible for a visit in 6 weeks.    Contact information:   [address] [place] [address] [phone]           Signed: Marlinda Mike 12/08/2011, 10:55 AM

## 2011-12-10 ENCOUNTER — Encounter (HOSPITAL_COMMUNITY): Payer: Self-pay | Admitting: Obstetrics

## 2013-10-13 ENCOUNTER — Other Ambulatory Visit: Payer: Self-pay | Admitting: Obstetrics and Gynecology

## 2013-10-19 ENCOUNTER — Encounter (HOSPITAL_COMMUNITY): Payer: Self-pay | Admitting: Pharmacist

## 2013-10-28 ENCOUNTER — Encounter (HOSPITAL_COMMUNITY): Payer: Self-pay

## 2013-10-29 ENCOUNTER — Encounter (HOSPITAL_COMMUNITY)
Admission: RE | Admit: 2013-10-29 | Discharge: 2013-10-29 | Disposition: A | Discharge status: Home / Self Care | Payer: Federal, State, Local not specified - PPO | Source: Ambulatory Visit | Attending: Obstetrics and Gynecology | Admitting: Obstetrics and Gynecology

## 2013-10-29 ENCOUNTER — Encounter (HOSPITAL_COMMUNITY): Payer: Self-pay

## 2013-10-29 HISTORY — DX: Gastro-esophageal reflux disease without esophagitis: K21.9

## 2013-10-29 LAB — CBC
HEMATOCRIT: 38.6 % (ref 36.0–46.0)
Hemoglobin: 13 g/dL (ref 12.0–15.0)
MCH: 30.5 pg (ref 26.0–34.0)
MCHC: 33.7 g/dL (ref 30.0–36.0)
MCV: 90.6 fL (ref 78.0–100.0)
Platelets: 219 10*3/uL (ref 150–400)
RBC: 4.26 MIL/uL (ref 3.87–5.11)
RDW: 14.2 % (ref 11.5–15.5)
WBC: 9.9 10*3/uL (ref 4.0–10.5)

## 2013-10-29 LAB — TYPE AND SCREEN
ABO/RH(D): O POS
Antibody Screen: NEGATIVE

## 2013-10-29 LAB — RPR: RPR Ser Ql: NONREACTIVE

## 2013-10-29 NOTE — H&P (Signed)
NAMMyrtie Castro:  Castro, Molly                ACCOUNT NO.:  [id number]  MEDICAL RECORD NO.:  [id number]  LOCATION:  [facility]                         FACILITY:  [facility]  PHYSICIAN:  Lenoard Adenichard J. Ivory Bail, M.D.DATE OF BIRTH:  07-27-1981  DATE OF ADMISSION:  04/02/2013 DATE OF DISCHARGE:                             HISTORY & PHYSICAL   CHIEF COMPLAINT:  Previous C-section, for repeat.  HISTORY OF PRESENT ILLNESS:  She is a 33 year old white female, G2, P1, at 7339 weeks' gestation, for repeat C-section.  MEDICATIONS:  Prenatal vitamins, folic acid.  PREVIOUS PREGNANCY HISTORY:  C-section x1.  FAMILY HISTORY:  Contributory for breast cancer, skin cancer, heart disease, anemia, pancreatic cancer.  SOCIAL HISTORY:  Noncontributory, nonsmoker, nondrinker.  Denies domestic or physical violence.  PRENATAL COURSE:  Uncomplicated.  GBS is positive.  PHYSICAL EXAMINATION:  GENERAL:  She is a well-developed, well- nourished, white female, in no acute distress. HEENT:  Normal. NECK:  Supple.  Full range of motion. LUNGS:  Clear. HEART:  Regular rate and rhythm. ABDOMEN:  Soft, gravid, nontender.  Estimated fetal weight 8 pounds. Cervix is 2-3 cm, 80%, vertex, -1. EXTREMITIES:  There are no cords. NEUROLOGIC:  Nonfocal. SKIN:  Intact.  IMPRESSION:  Previous cesarean section, for repeat.  PLAN:  Proceed with repeat low segment transverse cesarean section. Risks of anesthesia, infection, bleeding, injury to surrounding organs, need for repair discussed, delayed versus immediate complications to include bowel and bladder injury noted.  The patient acknowledges and wishes to proceed.     Lenoard Adenichard J. Adam Demary, M.D.     RJT/MEDQ  D:  10/29/2013  T:  10/29/2013  Job:  [id number]

## 2013-10-29 NOTE — Patient Instructions (Addendum)
Your procedure is scheduled on: Saturday, Feb. 14, 2015  [facility]: 8[facility]30 am  Pick up the phone at the desk and dial [phone].  Call this number if you have problems the morning of surgery: [phone].  Remember: Do NOT eat food: AFTER MIDNIGHT TONIGHT Do NOT drink clear liquids after: AFTER MIDNIGHT TONIGHT  Take these medicines the morning of surgery with a SIP OF WATER: ZANTAC  Do NOT wear jewelry (body piercing), make-up, or nail polish. Do NOT wear lotions, powders, or perfumes.  You may wear deoderant. Do NOT shave for 48 hours prior to surgery. Do NOT bring valuables to the hospital. Contacts, dentures, or bridgework may not be worn into surgery. Leave suitcase in car.  After surgery it may be brought to your room.  For patients admitted to the hospital, checkout time is 11:00 AM the day of discharge.

## 2013-10-30 ENCOUNTER — Inpatient Hospital Stay (HOSPITAL_COMMUNITY)
Admission: AD | Admit: 2013-10-30 | Discharge: 2013-10-30 | Disposition: A | Discharge status: Home / Self Care | Payer: Federal, State, Local not specified - PPO | Source: Ambulatory Visit | Attending: Obstetrics and Gynecology | Admitting: Obstetrics and Gynecology

## 2013-10-30 ENCOUNTER — Inpatient Hospital Stay (HOSPITAL_COMMUNITY): Payer: Federal, State, Local not specified - PPO | Admitting: Anesthesiology

## 2013-10-30 ENCOUNTER — Encounter (HOSPITAL_COMMUNITY)
Admission: AD | Disposition: A | Discharge status: Home / Self Care | Payer: Self-pay | Source: Ambulatory Visit | Attending: Obstetrics and Gynecology

## 2013-10-30 ENCOUNTER — Encounter (HOSPITAL_COMMUNITY): Payer: Self-pay | Admitting: *Deleted

## 2013-10-30 ENCOUNTER — Inpatient Hospital Stay (HOSPITAL_COMMUNITY): Admission: RE | Admit: 2013-10-30 | Payer: Self-pay | Source: Ambulatory Visit

## 2013-10-30 ENCOUNTER — Encounter (HOSPITAL_COMMUNITY): Payer: Federal, State, Local not specified - PPO | Admitting: Anesthesiology

## 2013-10-30 ENCOUNTER — Inpatient Hospital Stay (HOSPITAL_COMMUNITY)
Admission: AD | Admit: 2013-10-30 | Discharge: 2013-11-01 | DRG: 766 | Disposition: A | Discharge status: Home / Self Care | Payer: Federal, State, Local not specified - PPO | Source: Ambulatory Visit | Attending: Obstetrics and Gynecology | Admitting: Obstetrics and Gynecology

## 2013-10-30 DIAGNOSIS — O34219 Maternal care for unspecified type scar from previous cesarean delivery: Principal | ICD-10-CM | POA: Diagnosis present

## 2013-10-30 DIAGNOSIS — Z22330 Carrier of Group B streptococcus: Secondary | ICD-10-CM

## 2013-10-30 DIAGNOSIS — O9989 Other specified diseases and conditions complicating pregnancy, childbirth and the puerperium: Secondary | ICD-10-CM

## 2013-10-30 DIAGNOSIS — O99892 Other specified diseases and conditions complicating childbirth: Secondary | ICD-10-CM | POA: Diagnosis present

## 2013-10-30 SURGERY — Surgical Case
Anesthesia: Spinal | Site: Abdomen

## 2013-10-30 MED ORDER — KETOROLAC TROMETHAMINE 30 MG/ML IJ SOLN: 30.0000 mg | Freq: Four times a day (QID) | INTRAMUSCULAR | Status: AC | PRN

## 2013-10-30 MED ORDER — SIMETHICONE 80 MG PO CHEW
80.0000 mg | CHEWABLE_TABLET | Freq: Three times a day (TID) | ORAL | Status: DC
  Administered 2013-10-31 – 2013-11-01 (×4): 80 mg via ORAL
  Filled 2013-10-30 (×4): qty 1

## 2013-10-30 MED ORDER — PROMETHAZINE HCL 25 MG/ML IJ SOLN: 6.2500 mg | INTRAMUSCULAR | Status: DC | PRN

## 2013-10-30 MED ORDER — MENTHOL 3 MG MT LOZG: 1.0000 | LOZENGE | OROMUCOSAL | Status: DC | PRN

## 2013-10-30 MED ORDER — BUPIVACAINE HCL (PF) 0.25 % IJ SOLN
INTRAMUSCULAR | Status: AC
  Filled 2013-10-30: qty 30

## 2013-10-30 MED ORDER — KETOROLAC TROMETHAMINE 30 MG/ML IJ SOLN
INTRAMUSCULAR | Status: AC
  Administered 2013-10-30: 30 mg via INTRAVENOUS
  Filled 2013-10-30: qty 1

## 2013-10-30 MED ORDER — MORPHINE SULFATE 0.5 MG/ML IJ SOLN
INTRAMUSCULAR | Status: AC
  Filled 2013-10-30: qty 10

## 2013-10-30 MED ORDER — ONDANSETRON HCL 4 MG/2ML IJ SOLN
INTRAMUSCULAR | Status: AC
  Filled 2013-10-30: qty 2

## 2013-10-30 MED ORDER — KETOROLAC TROMETHAMINE 30 MG/ML IJ SOLN
30.0000 mg | Freq: Four times a day (QID) | INTRAMUSCULAR | Status: AC | PRN
  Administered 2013-10-30: 30 mg via INTRAVENOUS

## 2013-10-30 MED ORDER — ZOLPIDEM TARTRATE 5 MG PO TABS: 5.0000 mg | ORAL_TABLET | Freq: Every evening | ORAL | Status: DC | PRN

## 2013-10-30 MED ORDER — ONDANSETRON HCL 4 MG/2ML IJ SOLN
INTRAMUSCULAR | Status: DC | PRN
  Administered 2013-10-30: 4 mg via INTRAVENOUS

## 2013-10-30 MED ORDER — FENTANYL CITRATE 0.05 MG/ML IJ SOLN
INTRAMUSCULAR | Status: AC
  Filled 2013-10-30: qty 2

## 2013-10-30 MED ORDER — LACTATED RINGERS IV SOLN: INTRAVENOUS | Status: DC

## 2013-10-30 MED ORDER — LANOLIN HYDROUS EX OINT: 1.0000 "application " | TOPICAL_OINTMENT | CUTANEOUS | Status: DC | PRN

## 2013-10-30 MED ORDER — DIPHENHYDRAMINE HCL 50 MG/ML IJ SOLN
12.5000 mg | INTRAMUSCULAR | Status: DC | PRN
  Administered 2013-10-31: 12.5 mg via INTRAVENOUS
  Filled 2013-10-30: qty 1

## 2013-10-30 MED ORDER — PHENYLEPHRINE 8 MG IN D5W 100 ML (0.08MG/ML) PREMIX OPTIME
INJECTION | INTRAVENOUS | Status: AC
  Filled 2013-10-30: qty 100

## 2013-10-30 MED ORDER — DIPHENHYDRAMINE HCL 25 MG PO CAPS
25.0000 mg | ORAL_CAPSULE | Freq: Four times a day (QID) | ORAL | Status: DC | PRN
  Filled 2013-10-30: qty 1

## 2013-10-30 MED ORDER — METHYLERGONOVINE MALEATE 0.2 MG/ML IJ SOLN
INTRAMUSCULAR | Status: AC
  Administered 2013-10-30: 0.2 mg via INTRAMUSCULAR
  Filled 2013-10-30: qty 1

## 2013-10-30 MED ORDER — SCOPOLAMINE 1 MG/3DAYS TD PT72
1.0000 | MEDICATED_PATCH | Freq: Once | TRANSDERMAL | Status: DC
  Administered 2013-10-30: 1.5 mg via TRANSDERMAL

## 2013-10-30 MED ORDER — FENTANYL CITRATE 0.05 MG/ML IJ SOLN
INTRAMUSCULAR | Status: DC | PRN
  Administered 2013-10-30: 12.5 ug via INTRATHECAL

## 2013-10-30 MED ORDER — CEFAZOLIN SODIUM-DEXTROSE 2-3 GM-% IV SOLR
2.0000 g | INTRAVENOUS | Status: AC
  Administered 2013-10-30: 2 g via INTRAVENOUS

## 2013-10-30 MED ORDER — NALOXONE HCL 1 MG/ML IJ SOLN
1.0000 ug/kg/h | INTRAVENOUS | Status: DC | PRN
  Filled 2013-10-30: qty 2

## 2013-10-30 MED ORDER — PHENYLEPHRINE 8 MG IN D5W 100 ML (0.08MG/ML) PREMIX OPTIME
INJECTION | INTRAVENOUS | Status: DC | PRN
  Administered 2013-10-30: 60 ug/min via INTRAVENOUS

## 2013-10-30 MED ORDER — MORPHINE SULFATE (PF) 0.5 MG/ML IJ SOLN
INTRAMUSCULAR | Status: DC | PRN
Start: 2013-10-30 — End: 2013-10-30
  Administered 2013-10-30: .2 mg via INTRATHECAL

## 2013-10-30 MED ORDER — OXYTOCIN 40 UNITS IN LACTATED RINGERS INFUSION - SIMPLE MED: 62.5000 mL/h | INTRAVENOUS | Status: AC

## 2013-10-30 MED ORDER — ONDANSETRON HCL 4 MG/2ML IJ SOLN: 4.0000 mg | INTRAMUSCULAR | Status: DC | PRN

## 2013-10-30 MED ORDER — NALOXONE HCL 0.4 MG/ML IJ SOLN: 0.4000 mg | INTRAMUSCULAR | Status: DC | PRN

## 2013-10-30 MED ORDER — NALBUPHINE HCL 10 MG/ML IJ SOLN
5.0000 mg | INTRAMUSCULAR | Status: DC | PRN
  Administered 2013-10-31 (×2): 10 mg via SUBCUTANEOUS
  Filled 2013-10-30 (×2): qty 1

## 2013-10-30 MED ORDER — METHYLERGONOVINE MALEATE 0.2 MG/ML IJ SOLN
0.2000 mg | INTRAMUSCULAR | Status: DC | PRN
  Administered 2013-10-30: 0.2 mg via INTRAMUSCULAR

## 2013-10-30 MED ORDER — SODIUM CHLORIDE 0.9 % IJ SOLN: 3.0000 mL | INTRAMUSCULAR | Status: DC | PRN

## 2013-10-30 MED ORDER — NALBUPHINE HCL 10 MG/ML IJ SOLN
5.0000 mg | INTRAMUSCULAR | Status: DC | PRN
  Administered 2013-10-30: 5 mg via INTRAVENOUS
  Administered 2013-10-30: 10 mg via INTRAVENOUS
  Filled 2013-10-30 (×2): qty 1

## 2013-10-30 MED ORDER — MEPERIDINE HCL 25 MG/ML IJ SOLN: 6.2500 mg | INTRAMUSCULAR | Status: DC | PRN

## 2013-10-30 MED ORDER — IBUPROFEN 600 MG PO TABS
600.0000 mg | ORAL_TABLET | Freq: Four times a day (QID) | ORAL | Status: DC
  Administered 2013-10-31 – 2013-11-01 (×5): 600 mg via ORAL
  Filled 2013-10-30 (×7): qty 1

## 2013-10-30 MED ORDER — OXYTOCIN 10 UNIT/ML IJ SOLN
40.0000 [IU] | INTRAVENOUS | Status: DC | PRN
  Administered 2013-10-30: 40 [IU] via INTRAVENOUS

## 2013-10-30 MED ORDER — DIPHENHYDRAMINE HCL 50 MG/ML IJ SOLN: 25.0000 mg | INTRAMUSCULAR | Status: DC | PRN

## 2013-10-30 MED ORDER — SENNOSIDES-DOCUSATE SODIUM 8.6-50 MG PO TABS
2.0000 | ORAL_TABLET | ORAL | Status: DC
  Administered 2013-10-31 (×2): 2 via ORAL
  Filled 2013-10-30 (×2): qty 2

## 2013-10-30 MED ORDER — ONDANSETRON HCL 4 MG/2ML IJ SOLN: 4.0000 mg | Freq: Three times a day (TID) | INTRAMUSCULAR | Status: DC | PRN

## 2013-10-30 MED ORDER — SCOPOLAMINE 1 MG/3DAYS TD PT72
1.0000 | MEDICATED_PATCH | Freq: Once | TRANSDERMAL | Status: DC
  Filled 2013-10-30: qty 1

## 2013-10-30 MED ORDER — SIMETHICONE 80 MG PO CHEW
80.0000 mg | CHEWABLE_TABLET | ORAL | Status: DC
  Administered 2013-10-31 (×2): 80 mg via ORAL
  Filled 2013-10-30 (×2): qty 1

## 2013-10-30 MED ORDER — HYDROMORPHONE HCL PF 1 MG/ML IJ SOLN
INTRAMUSCULAR | Status: AC
  Administered 2013-10-30: 0.25 mg via INTRAVENOUS
  Filled 2013-10-30: qty 1

## 2013-10-30 MED ORDER — DIBUCAINE 1 % RE OINT: 1.0000 "application " | TOPICAL_OINTMENT | RECTAL | Status: DC | PRN

## 2013-10-30 MED ORDER — OXYCODONE-ACETAMINOPHEN 5-325 MG PO TABS
1.0000 | ORAL_TABLET | ORAL | Status: DC | PRN
Start: 2013-10-30 — End: 2013-11-01
  Administered 2013-10-31 – 2013-11-01 (×8): 1 via ORAL
  Filled 2013-10-30 (×8): qty 1

## 2013-10-30 MED ORDER — LACTATED RINGERS IV SOLN
INTRAVENOUS | Status: DC | PRN
  Administered 2013-10-30 (×2): via INTRAVENOUS

## 2013-10-30 MED ORDER — SIMETHICONE 80 MG PO CHEW: 80.0000 mg | CHEWABLE_TABLET | ORAL | Status: DC | PRN

## 2013-10-30 MED ORDER — PRENATAL MULTIVITAMIN CH
1.0000 | ORAL_TABLET | Freq: Every day | ORAL | Status: DC
  Administered 2013-10-31: 1 via ORAL
  Filled 2013-10-30: qty 1

## 2013-10-30 MED ORDER — METHYLERGONOVINE MALEATE 0.2 MG PO TABS: 0.2000 mg | ORAL_TABLET | ORAL | Status: DC | PRN

## 2013-10-30 MED ORDER — BUPIVACAINE HCL (PF) 0.25 % IJ SOLN
INTRAMUSCULAR | Status: DC | PRN
  Administered 2013-10-30: 10 mL

## 2013-10-30 MED ORDER — BUPIVACAINE IN DEXTROSE 0.75-8.25 % IT SOLN
INTRATHECAL | Status: DC | PRN
  Administered 2013-10-30: 1.4 mL via INTRATHECAL

## 2013-10-30 MED ORDER — OXYTOCIN 10 UNIT/ML IJ SOLN
INTRAMUSCULAR | Status: AC
  Filled 2013-10-30: qty 4

## 2013-10-30 MED ORDER — WITCH HAZEL-GLYCERIN EX PADS: 1.0000 "application " | MEDICATED_PAD | CUTANEOUS | Status: DC | PRN

## 2013-10-30 MED ORDER — SCOPOLAMINE 1 MG/3DAYS TD PT72
MEDICATED_PATCH | TRANSDERMAL | Status: AC
  Administered 2013-10-30: 1.5 mg via TRANSDERMAL
  Filled 2013-10-30: qty 1

## 2013-10-30 MED ORDER — DIPHENHYDRAMINE HCL 25 MG PO CAPS
25.0000 mg | ORAL_CAPSULE | ORAL | Status: DC | PRN
  Administered 2013-10-31: 25 mg via ORAL
  Filled 2013-10-30: qty 1

## 2013-10-30 MED ORDER — ONDANSETRON HCL 4 MG PO TABS: 4.0000 mg | ORAL_TABLET | ORAL | Status: DC | PRN

## 2013-10-30 MED ORDER — LACTATED RINGERS IV SOLN
INTRAVENOUS | Status: DC
  Administered 2013-10-30: 50 mL/h via INTRAVENOUS

## 2013-10-30 MED ORDER — HYDROMORPHONE HCL PF 1 MG/ML IJ SOLN
0.2500 mg | INTRAMUSCULAR | Status: DC | PRN
  Administered 2013-10-30 (×2): 0.25 mg via INTRAVENOUS
  Administered 2013-10-30 (×3): 0.5 mg via INTRAVENOUS

## 2013-10-30 MED ORDER — KETOROLAC TROMETHAMINE 30 MG/ML IJ SOLN: 15.0000 mg | Freq: Once | INTRAMUSCULAR | Status: DC | PRN

## 2013-10-30 MED ORDER — TETANUS-DIPHTH-ACELL PERTUSSIS 5-2.5-18.5 LF-MCG/0.5 IM SUSP: 0.5000 mL | Freq: Once | INTRAMUSCULAR | Status: DC

## 2013-10-30 MED ORDER — CEFAZOLIN SODIUM-DEXTROSE 2-3 GM-% IV SOLR
INTRAVENOUS | Status: AC
  Filled 2013-10-30: qty 50

## 2013-10-30 MED ORDER — HYDROMORPHONE HCL PF 1 MG/ML IJ SOLN
INTRAMUSCULAR | Status: AC
  Administered 2013-10-30: 0.5 mg via INTRAVENOUS
  Filled 2013-10-30: qty 1

## 2013-10-30 MED ORDER — METOCLOPRAMIDE HCL 5 MG/ML IJ SOLN: 10.0000 mg | Freq: Three times a day (TID) | INTRAMUSCULAR | Status: DC | PRN

## 2013-10-30 MED ORDER — IBUPROFEN 600 MG PO TABS
600.0000 mg | ORAL_TABLET | Freq: Four times a day (QID) | ORAL | Status: DC | PRN
  Administered 2013-10-30 – 2013-11-01 (×2): 600 mg via ORAL

## 2013-10-30 SURGICAL SUPPLY — 33 items
CLAMP CORD UMBIL (MISCELLANEOUS) IMPLANT
CLOTH BEACON ORANGE TIMEOUT ST (SAFETY) ×2 IMPLANT
CONTAINER PREFILL 10% NBF 15ML (MISCELLANEOUS) IMPLANT
DERMABOND ADVANCED (GAUZE/BANDAGES/DRESSINGS) ×1
DERMABOND ADVANCED .7 DNX12 (GAUZE/BANDAGES/DRESSINGS) ×1 IMPLANT
DRAPE LG THREE QUARTER DISP (DRAPES) IMPLANT
DRSG OPSITE POSTOP 4X10 (GAUZE/BANDAGES/DRESSINGS) ×2 IMPLANT
DURAPREP 26ML APPLICATOR (WOUND CARE) ×2 IMPLANT
ELECT REM PT RETURN 9FT ADLT (ELECTROSURGICAL) ×2
ELECTRODE REM PT RTRN 9FT ADLT (ELECTROSURGICAL) ×1 IMPLANT
EXTRACTOR VACUUM M CUP 4 TUBE (SUCTIONS) IMPLANT
GLOVE BIO SURGEON STRL SZ7.5 (GLOVE) ×2 IMPLANT
GOWN PREVENTION PLUS XLARGE (GOWN DISPOSABLE) ×2 IMPLANT
GOWN STRL REIN XL XLG (GOWN DISPOSABLE) ×2 IMPLANT
KIT ABG SYR 3ML LUER SLIP (SYRINGE) IMPLANT
NEEDLE HYPO 25X1 1.5 SAFETY (NEEDLE) ×2 IMPLANT
NEEDLE HYPO 25X5/8 SAFETYGLIDE (NEEDLE) IMPLANT
NS IRRIG 1000ML POUR BTL (IV SOLUTION) ×2 IMPLANT
PACK C SECTION WH (CUSTOM PROCEDURE TRAY) ×2 IMPLANT
STAPLER VISISTAT 35W (STAPLE) IMPLANT
SUT MNCRL 0 VIOLET CTX 36 (SUTURE) ×2 IMPLANT
SUT MNCRL AB 3-0 PS2 27 (SUTURE) IMPLANT
SUT MON AB 2-0 CT1 27 (SUTURE) ×2 IMPLANT
SUT MON AB-0 CT1 36 (SUTURE) ×4 IMPLANT
SUT MONOCRYL 0 CTX 36 (SUTURE) ×2
SUT PLAIN 0 NONE (SUTURE) IMPLANT
SUT PLAIN 2 0 (SUTURE)
SUT PLAIN 2 0 XLH (SUTURE) IMPLANT
SUT PLAIN ABS 2-0 CT1 27XMFL (SUTURE) IMPLANT
SYR CONTROL 10ML LL (SYRINGE) ×2 IMPLANT
TOWEL OR 17X24 6PK STRL BLUE (TOWEL DISPOSABLE) ×2 IMPLANT
TRAY FOLEY CATH 14FR (SET/KITS/TRAYS/PACK) ×2 IMPLANT
WATER STERILE IRR 1000ML POUR (IV SOLUTION) ×2 IMPLANT

## 2013-10-30 NOTE — Progress Notes (Signed)
Patient seen and examined. Consent witnessed and signed. No changes noted. Update completed. . CBC    Component Value Date/Time   WBC 9.9 10/29/2013 0925   RBC 4.26 10/29/2013 0925   HGB 13.0 10/29/2013 0925   HCT 38.6 10/29/2013 0925   PLT 219 10/29/2013 0925   MCV 90.6 10/29/2013 0925   MCH 30.5 10/29/2013 0925   MCHC 33.7 10/29/2013 0925   RDW 14.2 10/29/2013 0925

## 2013-10-30 NOTE — Lactation Note (Signed)
This note was copied from the chart of Molly Myrtie CruiseJaclyn Castro. Lactation Consultation Note  Patient Name: Molly Castro ZOXWR'UToday's Date: 10/30/2013 Reason for consult: Initial assessment of this mom and baby 7 hours after delivery.  This is mom's second child and she nursed first baby for 12 months. New baby has had a LATCH score of 9 and mom says she is latching much easier than her son.  Mom also states that she knows how to hand express colostrum/milk.  LC encouraged cue feedings and STS.  LC encouraged review of Baby and Me pp 9, 14 and 20-25 for STS and BF information. LC provided [employer] Resource brochure and reviewed [facility] services and list of community and web site resources.    Maternal Data Formula Feeding for Exclusion: No Infant to breast within first hour of birth: Yes Has patient been taught Hand Expression?: Yes (mom informs LC that she knows how to hand express) Does the patient have breastfeeding experience prior to this delivery?: Yes  Feeding    LATCH Score/Interventions           initial LATCH score=9 per RN assessment           Lactation Tools Discussed/Used   STS, cue feedings, hand expression  Consult Status Consult Status: Follow-up Date: 10/31/13 Follow-up type: In-patient    Warrick ParisianBryant, [name] [facility] 10/30/2013, 7:22 PM

## 2013-10-30 NOTE — Op Note (Signed)
Cesarean Section Procedure Note  Indications: previous uterine incision kerr x one  Pre-operative Diagnosis: 39 week 2 day pregnancy.  Post-operative Diagnosis: same  Surgeon: Lenoard AdenAAVON,Cebastian Neis J   Assistants: Denyse AmassBhambri, CNM  Anesthesia: Local anesthesia 0.25.% bupivacaine and Spinal anesthesia  ASA Class: 2  Procedure Details  The patient was seen in the Holding Room. The risks, benefits, complications, treatment options, and expected outcomes were discussed with the patient.  The patient concurred with the proposed plan, giving informed consent. The risks of anesthesia, infection, bleeding and possible injury to other organs discussed. Injury to bowel, bladder, or ureter with possible need for repair discussed. Possible need for transfusion with secondary risks of hepatitis or HIV acquisition discussed. Post operative complications to include but not limited to DVT, PE and Pneumonia noted. The site of surgery properly noted/marked. The patient was taken to Operating Room # 9, identified as Molly Castro and the procedure verified as C-Section Delivery. A Time Out was held and the above information confirmed.  After induction of anesthesia, the patient was draped and prepped in the usual sterile manner. A Pfannenstiel incision was made and carried down through the subcutaneous tissue to the fascia. Fascial incision was made and extended transversely using Mayo scissors. The fascia was separated from the underlying rectus tissue superiorly and inferiorly. The peritoneum was identified and entered. Peritoneal incision was extended longitudinally. The utero-vesical peritoneal reflection was incised transversely and the bladder flap was bluntly freed from the lower uterine segment. A low transverse uterine incision(Kerr hysterotomy) was made. Delivered from OA presentation was a  female with Apgar scores of 9 at one minute and 9 at five minutes. Bulb suctioning gently performed. Neonatal team in  attendance.After the umbilical cord was clamped and cut cord blood was obtained for evaluation. The placenta was removed intact and appeared normal. The uterus was curetted with a dry lap pack. Good hemostasis was noted.The uterine outline, tubes and ovaries appeared normal. The uterine incision was closed with running locked sutures of 0 Monocryl x 2 layers. Hemostasis was observed. Lavage was carried out until clear.The parietal peritoneum was closed with a running 2-0 Monocryl suture. The fascia was then reapproximated with running sutures of 0 Monocryl. The skin was reapproximated with 3-0 monocryl after [place] closure with 2-0 plain..  Instrument, sponge, and needle counts were correct prior the abdominal closure and at the conclusion of the case.   Findings: Female, post placenta , nl tubes, nl ovaries.  Estimated Blood Loss:  300 mL         Drains: foley                 Specimens: above                 Complications:  None; patient tolerated the procedure well.         Disposition: PACU - hemodynamically stable.         Condition: stable  Attending Attestation: I performed the procedure.

## 2013-10-30 NOTE — Transfer of Care (Signed)
Immediate Anesthesia Transfer of Care Note  Patient: Molly Castro  Procedure(s) Performed: Procedure(s) with comments: Repeat CESAREAN SECTION (N/A) - EDD: 11/04/13  Patient Location: PACU  Anesthesia Type:Spinal  Level of Consciousness: awake  Airway & Oxygen Therapy: Patient Spontanous Breathing  Post-op Assessment: Report given to PACU RN  Post vital signs: Reviewed and stable  Complications: No apparent anesthesia complications

## 2013-10-30 NOTE — Anesthesia Postprocedure Evaluation (Signed)
Anesthesia Post Note  Patient: Molly Castro  Procedure(s) Performed: Procedure(s) (LRB): Repeat CESAREAN SECTION (N/A)  Anesthesia type: Spinal  Patient location: PACU  Post pain: Pain level controlled  Post assessment: Post-op Vital signs reviewed  Last Vitals:  Filed Vitals:   10/30/13 1345  BP: 118/71  Pulse: 69  Temp: 36.8 C  Resp: 20    Post vital signs: Reviewed  Level of consciousness: awake  Complications: No apparent anesthesia complications

## 2013-10-30 NOTE — Anesthesia Preprocedure Evaluation (Signed)
Anesthesia Evaluation  Patient identified by MRN, date of birth, ID band Patient awake    Reviewed: Allergy & Precautions, H&P , NPO status , Patient's Chart, lab work & pertinent test results  Airway Mallampati: I TM Distance: >3 FB Neck ROM: full    Dental no notable dental hx.    Pulmonary neg pulmonary ROS,    Pulmonary exam normal       Cardiovascular negative cardio ROS      Neuro/Psych negative neurological ROS  negative psych ROS   GI/Hepatic negative GI ROS, Neg liver ROS,   Endo/Other  negative endocrine ROS  Renal/GU negative Renal ROS  negative genitourinary   Musculoskeletal negative musculoskeletal ROS (+)   Abdominal Normal abdominal exam  (+)   Peds  Hematology negative hematology ROS (+)   Anesthesia Other Findings   Reproductive/Obstetrics (+) Pregnancy                           Anesthesia Physical Anesthesia Plan  ASA: II  Anesthesia Plan: Spinal   Post-op Pain Management:    Induction:   Airway Management Planned:   Additional Equipment:   Intra-op Plan:   Post-operative Plan:   Informed Consent: I have reviewed the patients History and Physical, chart, labs and discussed the procedure including the risks, benefits and alternatives for the proposed anesthesia with the patient or authorized representative who has indicated his/her understanding and acceptance.     Plan Discussed with:   Anesthesia Plan Comments:         Anesthesia Quick Evaluation

## 2013-10-30 NOTE — Anesthesia Procedure Notes (Signed)
Spinal  Patient location during procedure: OR Start time: 10/30/2013 11:47 AM End time: 10/30/2013 11:50 AM Staffing Anesthesiologist: Leilani AbleHATCHETT, Tyrez Berrios Performed by: anesthesiologist  Preanesthetic Checklist Completed: patient identified, surgical consent, pre-op evaluation, timeout performed, IV checked, risks and benefits discussed and monitors and equipment checked Spinal Block Patient position: sitting Prep: DuraPrep Patient monitoring: heart rate, cardiac monitor, continuous pulse ox and blood pressure Approach: midline Location: L3-4 Injection technique: single-shot Needle Needle type: Sprotte  Needle gauge: 24 G Needle length: 9 cm Needle insertion depth: 5 cm Assessment Sensory level: T4

## 2013-10-31 LAB — CBC
HEMATOCRIT: 33.7 % — AB (ref 36.0–46.0)
Hemoglobin: 11.2 g/dL — ABNORMAL LOW (ref 12.0–15.0)
MCH: 30.3 pg (ref 26.0–34.0)
MCHC: 33.2 g/dL (ref 30.0–36.0)
MCV: 91.1 fL (ref 78.0–100.0)
Platelets: 197 10*3/uL (ref 150–400)
RBC: 3.7 MIL/uL — ABNORMAL LOW (ref 3.87–5.11)
RDW: 14.4 % (ref 11.5–15.5)
WBC: 12.4 10*3/uL — ABNORMAL HIGH (ref 4.0–10.5)

## 2013-10-31 NOTE — Lactation Note (Signed)
This note was copied from the chart of Girl Myrtie CruiseJaclyn Emmer. Lactation Consultation Note  Patient Name: Girl Myrtie CruiseJaclyn Wolfer ZOXWR'UToday's Date: 10/31/2013 Reason for consult: Follow-up assessment   Maternal Data    Feeding Feeding Type: Breast Fed Length of feed: 15 min  LATCH Score/Interventions Follow up visit at 25 hours of age. Mom reports breast feeding is going well and denies pain.  RN latch scores of 9 and greater then 8 feedings in past 24 hours.  Mom reports knowing how to hand express, early feeding cues and STS for feedings.  Mom is anticipating discharge in the morning.  Feeding frequency discussed.  Mom to call for assist as needed.                 Intervention(s): Breastfeeding basics reviewed     Lactation Tools Discussed/Used     Consult Status Consult Status: PRN    Jannifer RodneyShoptaw, Aleesha Ringstad Lynn 10/31/2013, 1:38 PM

## 2013-10-31 NOTE — Addendum Note (Signed)
Addendum created 10/31/13 0951 by Turner DanielsJennifer L Ondine Gemme, CRNA   Modules edited: Notes Section   Notes Section:  File: [id number]

## 2013-10-31 NOTE — Progress Notes (Signed)
Patient ID: Frederich ChaJaclyn E Canada, female   DOB: 29-Apr-1981, 33 y.o.   MRN: [id number] POD # 1  Subjective: Pt reports feeling well / Pain controlled with ibuprofen and percocet Tolerating po/ Foley d/c'ed and voiding without problems/ No n/v/Flatus pos Activity: out of bed and ambulate Bleeding is light Newborn info:  Information for the patient's newborn:  Meredith PelCasey, Girl Whitlee [[id number]][[id number]]  female Feeding: breast   Objective: VS: Blood pressure 113/71, pulse 77, temperature 98.2 F (36.8 C), temperature source Oral, resp. rate 18.    Intake/Output Summary (Last 24 hours) at 10/31/13 1126 Last data filed at 10/31/13 0610  Gross per 24 hour  Intake   2500 ml  Output   3085 ml  Net   -585 ml      Recent Labs  10/29/13 0925 10/31/13 0605  WBC 9.9 12.4*  HGB 13.0 11.2*  HCT 38.6 33.7*  PLT 219 197    Blood type: O POS Rubella:   Immune   Physical Exam:  General: alert, cooperative and no distress CV: Regular rate and rhythm Resp: clear Abdomen: soft, nontender, normal bowel sounds Incision: Covered with Tegaderm and honeycomb dressing; well approximated. Uterine Fundus: firm, below umbilicus, nontender Lochia: minimal Ext: Homans sign is negative, no sign of DVT and no edema, redness or tenderness in the calves or thighs    A/P: POD # 1/ [id number] S/P Elective, repeat C/Section Doing well and planning early d/c in the am Continue routine post op orders   Signed: Demetrius RevelFISHER,Delane Wessinger K, MSN, [facility] 10/31/2013, 11:26 AM

## 2013-10-31 NOTE — Anesthesia Postprocedure Evaluation (Signed)
  Anesthesia Post-op Note Anesthesia Post Note  Patient: Molly Castro  Procedure(s) Performed: Procedure(s) (LRB): Repeat CESAREAN SECTION (N/A)  Anesthesia type: Spinal  Patient location: Mother/Baby  Post pain: Pain level controlled  Post assessment: Post-op Vital signs reviewed  Last Vitals:  Filed Vitals:   10/31/13 0925  BP: 113/71  Pulse: 77  Temp: 36.8 C  Resp: 18    Post vital signs: Reviewed  Level of consciousness: awake  Complications: No apparent anesthesia complications

## 2013-11-01 ENCOUNTER — Encounter (HOSPITAL_COMMUNITY): Payer: Self-pay | Admitting: Obstetrics and Gynecology

## 2013-11-01 MED ORDER — OXYCODONE-ACETAMINOPHEN 5-325 MG PO TABS: 1.0000 | ORAL_TABLET | ORAL | Status: DC | PRN

## 2013-11-01 MED ORDER — DOCUSATE SODIUM 100 MG PO CAPS: 100.0000 mg | ORAL_CAPSULE | Freq: Two times a day (BID) | ORAL | Status: DC | PRN

## 2013-11-01 MED ORDER — IBUPROFEN 600 MG PO TABS: 600.0000 mg | ORAL_TABLET | Freq: Four times a day (QID) | ORAL | Status: DC | PRN

## 2013-11-01 NOTE — Lactation Note (Signed)
This note was copied from the chart of Molly Myrtie CruiseJaclyn Nellums. Lactation Consultation Note Follow up consult:  Baby Molly 6146 hours old and baby sleeping in mother's arms.  Mother states breastfeeding going well but she is a little sore and has scars from first child nursing.  Provided comfort gels and reviewed care and use of colostrum on  Nipples.  Reviewed engorgement care and lactation support services.  Encouraged mother to call for further assistance.   Patient Name: Molly Castro ZOXWR'UToday's Date: 11/01/2013 Reason for consult: Follow-up assessment   Maternal Data    Feeding Feeding Type: Breast Fed Length of feed: 20 min  LATCH Score/Interventions                      Lactation Tools Discussed/Used     Consult Status Consult Status: Complete    Hardie PulleyBerkelhammer, Ruth Boschen 11/01/2013, 10:27 AM

## 2013-11-01 NOTE — Progress Notes (Signed)
Patient ID: Molly Castro, female   DOB: 01/26/1981, 33 y.o.   MRN: [id number] POD # 3  Subjective: Pt reports feeling well and eager for d/c home/ Pain controlled with ibuprofen and percocet Tolerating po/Voiding without problems/ No n/v/Flatus pos Activity: out of bed and ambulate Bleeding is light Newborn info:  Information for the patient's newborn:  Meredith PelCasey, Girl Bernadette [[id number]][[id number]]  female Feeding: breast   Objective: VS: Blood pressure 112/65, pulse 63, temperature 98.1 F (36.7 C), temperature source Oral, resp. rate 18   LABS:  Recent Labs  10/29/13 0925 10/31/13 0605  WBC 9.9 12.4*  HGB 13.0 11.2*  PLT 219 197                             Physical Exam:  General: alert, cooperative and no distress CV: Regular rate and rhythm Resp: clear Abdomen: soft, nontender, normal bowel sounds Incision: well approximated, closed with subcuticular closure.  Covered with tegaderm and honeycomb dressing Uterine Fundus: firm, below umbilicus, nontender Lochia: minimal Ext: Homans sign is negative, no sign of DVT and no edema, redness or tenderness in the calves or thighs    A/P: POD # 3/ G2P2002/ S/P Elective repeat C/Section Doing well and stable for discharge home RX's: Ibuprofen 600mg  po Q 6 hrs prn pain #30 Refill x 1 Percocet 5/325 1 - 2 tabs po every 6 hrs prn pain  #30 No refill Colace 100mg  po up to TID prn #30 Ref x 1 Rt pp visit in 6 wks    Signed: Demetrius RevelFISHER,Minahil Quinlivan K, MSN, [facility] 11/01/2013, 8:32 AM

## 2013-11-01 NOTE — Discharge Summary (Signed)
Obstetric Discharge Summary Reason for Admission: G2 P1 0 0 1 @ 39wks for elective repeat C/S Prenatal Procedures: NST and ultrasound Intrapartum Procedures: cesarean: low cervical, transverse Postpartum Procedures: none Complications-Operative and Postpartum: none Hemoglobin  Date Value Ref Range Status  10/31/2013 11.2* 12.0 - 15.0 g/dL Final     HCT  Date Value Ref Range Status  10/31/2013 33.7* 36.0 - 46.0 % Final    Physical Exam:  General: alert, cooperative and no distress Lochia: appropriate Uterine Fundus: firm Incision: healing well; closed with subcuticular closure DVT Evaluation: No evidence of DVT seen on physical exam. Negative Homan's sign.  Discharge Diagnoses: G2 P2 s/p elective rpt C/S at 39wks  Discharge Information: Date: 11/01/2013 Activity: pelvic rest Diet: routine Medications: PNV, Ibuprofen, Colace and Percocet Condition: stable Instructions: refer to practice specific booklet Discharge to: home Follow-up Information   Follow up with Lenoard AdenAAVON,RICHARD J, MD In 6 weeks.   Specialty:  Obstetrics and Gynecology   Contact information:   [address] Severe[address] [place] [address] [phone]       Newborn Data: Live born female on 10/30/13 Birth Weight:  APGAR: 9, 9  Home with mother.  Charlee Squibb K 11/01/2013, 10:07 AM

## 2013-11-10 ENCOUNTER — Encounter (HOSPITAL_COMMUNITY): Payer: Self-pay | Admitting: *Deleted

## 2014-07-18 ENCOUNTER — Encounter (HOSPITAL_COMMUNITY): Payer: Self-pay | Admitting: *Deleted

## 2014-09-14 ENCOUNTER — Ambulatory Visit: Payer: Federal, State, Local not specified - PPO | Admitting: Physician Assistant

## 2014-10-14 ENCOUNTER — Encounter: Payer: Self-pay | Admitting: *Deleted

## 2014-10-26 ENCOUNTER — Encounter: Payer: Self-pay | Admitting: Internal Medicine

## 2014-10-26 ENCOUNTER — Ambulatory Visit (INDEPENDENT_AMBULATORY_CARE_PROVIDER_SITE_OTHER): Payer: Federal, State, Local not specified - PPO | Admitting: Internal Medicine

## 2014-10-26 VITALS — BP 105/60 | HR 77 | Ht 65.0 in | Wt 124.4 lb

## 2014-10-26 DIAGNOSIS — K219 Gastro-esophageal reflux disease without esophagitis: Secondary | ICD-10-CM

## 2014-10-26 DIAGNOSIS — Z800 Family history of malignant neoplasm of digestive organs: Secondary | ICD-10-CM

## 2014-10-26 NOTE — Progress Notes (Signed)
Patient ID: Molly Castro, female   DOB: [birth year], 34 y.o.   MRN: [id number] HPI: Molly Castro is a 34 yo female with little PMH who is seen in consultation at the request of Dani Gobble, PA-C to discuss her family history of colon cancer.  She is here alone today.  She is feeling well and denies current GI complaint. Bowel movements are regular and without diarrhea or constipation. No rectal bleeding or melena. No change in bowel habit. She did have an episode of rectal bleeding in 2004 which led to a flexible sigmoidoscopy which she reports was negative. At that time she was having painless rectal bleeding and she feels this was related to internal hemorrhoids. No recent bleeding as previously mentioned. Appetite is good and her weight has been stable. No nausea or vomiting. Rare heartburn which was worse during her pregnancy. She has 2 children age 89 and 1, both delivered by cesarean. Periods have been irregular but now regular again on oral contraceptive pills  She works as a Armed forces operational officer.   She has a family history that is significant for a mother and maternal grandmother both with colon cancer at age 80. Her mother had a screening colonoscopy at age 85 which was normal and then was diagnosed with stage III colorectal cancer near the rectosigmoid colon. This required surgical resection followed by 6 months of chemotherapy. She is currently felt to be cancer free.    Past Medical History  Diagnosis Date  . GERD (gastroesophageal reflux disease)     with pregnancy    Past Surgical History  Procedure Laterality Date  . Wisdom tooth extraction  2004  . Cesarean section  12/06/2011    Procedure: CESAREAN SECTION;  Surgeon: Tresa Endo A. Ernestina Penna, MD;  Location: [facility];  Service: Gynecology;  Laterality: N/A;  . Cesarean section N/A 10/30/2013    Procedure: Repeat CESAREAN SECTION;  Surgeon: Lenoard Aden, MD;  Location: [facility] ORS;  Service: Obstetrics;  Laterality: N/A;  EDD: 11/04/13     Outpatient Prescriptions Prior to Visit  Medication Sig Dispense Refill  . ibuprofen (ADVIL,MOTRIN) 600 MG tablet Take 1 tablet (600 mg total) by mouth every 6 (six) hours as needed for mild pain. 30 tablet 1  . Nutritional Supplements (JUICE PLUS FIBRE PO) Take 4 capsules by mouth daily.    . calcium carbonate (TUMS - DOSED IN MG ELEMENTAL CALCIUM) 500 MG chewable tablet Chew 1 tablet by mouth daily as needed. For heartburn    . diphenhydramine-acetaminophen (TYLENOL PM) 25-500 MG TABS Take 1 tablet by mouth at bedtime as needed.    . docusate sodium (COLACE) 100 MG capsule Take 1 capsule (100 mg total) by mouth 2 (two) times daily as needed. 30 capsule 1  . folic acid (FOLVITE) 400 MCG tablet Take 400 mcg by mouth every morning.    Marland Kitchen oxyCODONE-acetaminophen (PERCOCET/ROXICET) 5-325 MG per tablet Take 1-2 tablets by mouth every 4 (four) hours as needed for severe pain (moderate - severe pain). 30 tablet 0  . Prenatal Vit-Fe Fumarate-FA (PRENATAL MULTIVITAMIN) TABS Take 1 tablet by mouth every morning.    . ranitidine (ZANTAC) 150 MG tablet Take 150 mg by mouth at bedtime as needed for heartburn.     No facility-administered medications prior to visit.    No Known Allergies  Family History  Problem Relation Age of Onset  . Colon cancer Mother 34  . Colon cancer Maternal Grandmother     History  Substance Use Topics  .  Smoking status: Never Smoker   . Smokeless tobacco: Never Used  . Alcohol Use: No    ROS: As per history of present illness, otherwise negative  BP 105/60 mmHg  Pulse 77  Ht 5\' 5"  (1.651 m)  Wt 124 lb 6.4 oz (56.427 kg)  BMI 20.70 kg/m2  SpO2 98% Constitutional: Well-developed and well-nourished. No distress. HEENT: Normocephalic and atraumatic. Oropharynx is clear and moist. No oropharyngeal exudate. Conjunctivae are normal.  No scleral icterus. Neck: Neck supple. Trachea midline. Cardiovascular: Normal rate, regular rhythm and intact distal pulses. No  M/R/G Pulmonary/chest: Effort normal and breath sounds normal. No wheezing, rales or rhonchi. Abdominal: Soft, nontender, nondistended. Bowel sounds active throughout. There are no masses palpable. No hepatosplenomegaly.Cesarean scar well-healed with firmness at the left edge of the scar without definitive hernia  Extremities: no clubbing, cyanosis, or edema Lymphadenopathy: No cervical adenopathy noted. Neurological: Alert and oriented to person place and time. Skin: Skin is warm and dry. No rashes noted. Psychiatric: Normal mood and affect. Behavior is normal.   ASSESSMENT/PLAN: 34 yo female with little PMH who is seen in consultation at the request of Dani GobbleSara Carter Spencer, PA-C to discuss her family history of colon cancer.   1. Family history of colon cancer in mother and maternal grand-mother -- her family history is significant for colorectal cancer in both her mother and grandmother. She is currently having no GI alarm symptoms. I recommended beginning colonoscopy for elevated risk screening at age 34. This will be in 7 years. I would certainly perform colonoscopy immediately should she develop rectal bleeding or any change in bowel habit to warrant earlier examination. We also discussed that if performing colonoscopy at present would provide peace of mind and we could proceed at this time. After a thorough discussion, she is comfortable waiting until age 34. I asked that she notify me should she develop change in bowel habit, rectal bleeding, abdominal pain, etc. and she voiced understanding   2. GERD -- rare heartburn. She did have a cough for several months unresponsive to antibiotic. She took over-the-counter PPI and the cough improved. Cough has not returned. This raises the question of reflux disease. We discussed this and I would not recommend chronic PPI and less typical or atypical symptoms persisted. She will notify me should symptoms develop and we can discuss PPI at that time. She is  happy with this plan     ZO:XWRUCc:Sara Lollie MarrowC Spencer, Pa-c [address] [place], [place] [address]

## 2014-10-26 NOTE — Patient Instructions (Signed)
Please follow up with Dr Rhea BeltonPyrtle as needed.  You will be due for a colonoscopy at age 34.  CC:Dr Rudean HaskellBadger, Sarah Spencer, PA-C

## 2016-03-25 DIAGNOSIS — Z0000 Encounter for general adult medical examination without abnormal findings: Secondary | ICD-10-CM | POA: Diagnosis not present

## 2016-03-29 DIAGNOSIS — Z681 Body mass index (BMI) 19 or less, adult: Secondary | ICD-10-CM | POA: Diagnosis not present

## 2016-03-29 DIAGNOSIS — Z0000 Encounter for general adult medical examination without abnormal findings: Secondary | ICD-10-CM | POA: Diagnosis not present

## 2016-03-29 DIAGNOSIS — R7989 Other specified abnormal findings of blood chemistry: Secondary | ICD-10-CM | POA: Diagnosis not present

## 2016-04-03 DIAGNOSIS — H6122 Impacted cerumen, left ear: Secondary | ICD-10-CM | POA: Diagnosis not present

## 2016-04-29 DIAGNOSIS — K080 Exfoliation of teeth due to systemic causes: Secondary | ICD-10-CM | POA: Diagnosis not present

## 2016-05-17 DIAGNOSIS — E039 Hypothyroidism, unspecified: Secondary | ICD-10-CM | POA: Diagnosis not present

## 2016-07-11 DIAGNOSIS — E039 Hypothyroidism, unspecified: Secondary | ICD-10-CM | POA: Diagnosis not present

## 2016-08-23 DIAGNOSIS — Z01419 Encounter for gynecological examination (general) (routine) without abnormal findings: Secondary | ICD-10-CM | POA: Diagnosis not present

## 2016-08-23 DIAGNOSIS — Z6820 Body mass index (BMI) 20.0-20.9, adult: Secondary | ICD-10-CM | POA: Diagnosis not present

## 2016-10-07 DIAGNOSIS — K080 Exfoliation of teeth due to systemic causes: Secondary | ICD-10-CM | POA: Diagnosis not present

## 2016-11-28 DIAGNOSIS — R5383 Other fatigue: Secondary | ICD-10-CM | POA: Diagnosis not present

## 2016-11-28 DIAGNOSIS — R946 Abnormal results of thyroid function studies: Secondary | ICD-10-CM | POA: Diagnosis not present

## 2016-12-27 DIAGNOSIS — E039 Hypothyroidism, unspecified: Secondary | ICD-10-CM | POA: Diagnosis not present

## 2016-12-27 DIAGNOSIS — R5383 Other fatigue: Secondary | ICD-10-CM | POA: Diagnosis not present

## 2017-01-01 DIAGNOSIS — R140 Abdominal distension (gaseous): Secondary | ICD-10-CM | POA: Diagnosis not present

## 2017-01-01 DIAGNOSIS — E039 Hypothyroidism, unspecified: Secondary | ICD-10-CM | POA: Diagnosis not present

## 2017-01-27 ENCOUNTER — Telehealth: Payer: Self-pay | Admitting: Internal Medicine

## 2017-01-27 DIAGNOSIS — K600 Acute anal fissure: Secondary | ICD-10-CM | POA: Diagnosis not present

## 2017-01-27 NOTE — Telephone Encounter (Signed)
Symptoms as severe as described sounds most consistent with thrombosed external hemorrhoid Certainly she could be worked him at my next available is still 48 hours away, I am off tomorrow Thrombosed hemorrhoid would be best treated by general surgery rather than me Would see if [facility] urgently for thrombosed external hemorrhoid/perianal pain

## 2017-01-27 NOTE — Telephone Encounter (Signed)
Called [facility], they are working the patient in today at 4:30. Patient advised and will also keep her appointment with the APP here on Friday, as it is for a different issue.

## 2017-01-27 NOTE — Telephone Encounter (Signed)
Patient states that she started having rectal pressure about one week ago. Over the weekend she started having severe rectal pain, rated 10/10. Denies any rectal bleeding, she is having daily bm's. She called her insurance nurses line last night, was instructed to go to the ED. Patient did not go as she could not sit for that long. She has tried OTC products, sitz baths which do not seems to help that much. She took 800 mg of ibuprofen and a Tylenol PM last night and finally was able to sleep. She rates the pain 6/10 today. She already had appointment with APP on Friday for abdominal pain/bloating. She also has appointment with gyn. You do have an available appointment on Wed. 5/16, if you would like me to work her in.

## 2017-01-29 ENCOUNTER — Telehealth: Payer: Self-pay | Admitting: Internal Medicine

## 2017-01-29 NOTE — Telephone Encounter (Signed)
Left message for pt to call back.  Pt states she saw [facility] and was told that her bloating issues could be related to her fissure and the fact that she was not emptying completely. She is seeing her GYN Friday also and being checked for fibroids to see if that could be causing bloating and distension. Discussed with pt that we could cancel her appt on Friday and she can call back if she continues to have issues. Pt agreed with this plan and appt cancelled.

## 2017-01-30 DIAGNOSIS — R140 Abdominal distension (gaseous): Secondary | ICD-10-CM | POA: Diagnosis not present

## 2017-01-30 DIAGNOSIS — K602 Anal fissure, unspecified: Secondary | ICD-10-CM | POA: Diagnosis not present

## 2017-01-30 DIAGNOSIS — N898 Other specified noninflammatory disorders of vagina: Secondary | ICD-10-CM | POA: Diagnosis not present

## 2017-01-30 DIAGNOSIS — K5900 Constipation, unspecified: Secondary | ICD-10-CM | POA: Diagnosis not present

## 2017-01-30 DIAGNOSIS — N762 Acute vulvitis: Secondary | ICD-10-CM | POA: Diagnosis not present

## 2017-01-31 ENCOUNTER — Ambulatory Visit: Payer: Federal, State, Local not specified - PPO | Admitting: Nurse Practitioner

## 2017-01-31 DIAGNOSIS — R140 Abdominal distension (gaseous): Secondary | ICD-10-CM | POA: Diagnosis not present

## 2017-01-31 DIAGNOSIS — Z800 Family history of malignant neoplasm of digestive organs: Secondary | ICD-10-CM | POA: Diagnosis not present

## 2017-02-05 ENCOUNTER — Telehealth: Payer: Self-pay | Admitting: Internal Medicine

## 2017-02-05 NOTE — Telephone Encounter (Signed)
Pt still having issues with bloating. 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 C/O lots of abdominal bloating and distension. Concerned due to family history of colon cancer. Pt scheduled to see Willette ClusterPaula Guenther NP 02/07/17@11am . Pt aware of appt.

## 2017-02-07 ENCOUNTER — Encounter: Payer: Self-pay | Admitting: Nurse Practitioner

## 2017-02-07 ENCOUNTER — Ambulatory Visit (INDEPENDENT_AMBULATORY_CARE_PROVIDER_SITE_OTHER): Payer: Federal, State, Local not specified - PPO | Admitting: Nurse Practitioner

## 2017-02-07 VITALS — BP 120/74 | HR 88 | Ht 65.0 in | Wt 123.0 lb

## 2017-02-07 DIAGNOSIS — R198 Other specified symptoms and signs involving the digestive system and abdomen: Secondary | ICD-10-CM

## 2017-02-07 DIAGNOSIS — K602 Anal fissure, unspecified: Secondary | ICD-10-CM

## 2017-02-07 DIAGNOSIS — A601 Herpesviral infection of perianal skin and rectum: Secondary | ICD-10-CM

## 2017-02-07 NOTE — Patient Instructions (Addendum)
If you are age 35 or older, your body mass index should be between 23-30. Your Body mass index is 20.47 kg/m. If this is out of the aforementioned range listed, please consider follow up with your Primary Care Provider.  If you are age 35 or younger, your body mass index should be between 19-25. Your Body mass index is 20.47 kg/m. If this is out of the aformentioned range listed, please consider follow up with your Primary Care Provider.   Start Citrucel daily.  Continue stool softners.  Drink 64 ounces of water daily.  Try Balneol Lotion for itching.  Thank you for choosing me and [facility].   Willette ClusterPaula Guenther, NP

## 2017-02-07 NOTE — Progress Notes (Addendum)
HPI: Patient is a 36 year old female known to Dr. Rhea BeltonPyrtle. She has family history colon cancer in her mother and will begin her screening colonoscopies at age 36. She called our office 5/14 with complaints of severe rectal pressure and hemorrhoids. She had tried OTC products and sitz baths. She was also having abdominal pain and bloating and was scheduled to see her gynecologist for that but needed something done urgently for the rectal discomfort.  We referred her to [facility] for what sounded like a thrombosed external hemorrhoid. She was worked in to see Dr. Johna SheriffHoxworth on 01/27/17 . I requested and reviewed his office note. On exam patient was found to have a superficial posterior midline fissure. She had a separate area more anteriorly which resembled an abrasion.  For fissure she is using diltiazem cream, topical lidocaine, stool softener and MiraLAX as needed. Kashara then saw GYN to see if fibroids could be causing abdominal pain and bloating but workup apparently negative. 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 Patient had never had herpetic lesions before. For rectal discomfort GYN added rectal rockets.   Lockie ParesJaclyn is here with ongoing constant anorectal pressure. Having normal bowel movements but still has sensation of incomplete evacuation. No bleeding. She has perianal itching. Feels frustrated about all these concurrent sudden issues (fissure, herpes, bloating). She started a probiotic and bloating is better. The bloating was not related to dairy nor gluten.   Past Medical History:  Diagnosis Date  . GERD (gastroesophageal reflux disease)    with pregnancy    Patient's surgical history, family medical history, social history, medications and allergies were all reviewed in Epic    Physical Exam: BP 120/74 (BP Location: Left Arm, Patient Position: Sitting, Cuff Size: Normal)   Pulse 88   Ht 5\' 5"  (1.651 m)   Wt 123 lb (55.8 kg)   SpO2 98%   BMI 20.47  kg/m   GENERAL: well developed white female in NAD PSYCH: :Pleasant, cooperative, normal affect EENT:  conjunctiva pink, mucous membranes moist, neck supple without masses CARDIAC:  RRR, no murmur heard, no peripheral edema PULM: Normal respiratory effort, lungs CTA bilaterally, no wheezing ABDOMEN:  soft, nontender, nondistended, no obvious masses, no hepatomegaly,  normal bowel sounds RECTAL: no thrombosed hemorrhoids. No fissure seen today. No rectal masses felt. Anoscope inserted, no fissures or hemorrhoids appreciated.  SKIN:  turgor, no lesions seen Musculoskeletal:  Normal muscle tone, normal strength NEURO: Alert and oriented x 3, no focal neurologic deficits  ASSESSMENT and PLAN:  Pleasant 36 yo female with persistent anorectal pain / pressure / feeling of incomplete evacuation despite normal BMs. Symptoms in setting of an anal fissure as well as first herpes outbreak. I couldn't appreciate the fissure today, probably healing. Anorectal discomfort probably due to fissure combined with herpes outbreak. She also complains of intense perianal itching.    -continue diltiazem cream and topical lidocaine.  She has a follow up with Dr. Johna SheriffHoxworth in a few weeks.  -Avoid constipation / straining. Start Citrucel - 64 oz water daily.  -continue stool softeners -hemorrhoids can cause itching though I couldn't appreciate any hemorrhoids after she used steroid suppositories over last few days.  Trial of Balneol for perianal itching.  -Keep follow up with Dr. Johna SheriffHoxworth. If symptoms persists after resolution of fissure and herpes outbreak then we will consider colonoscopy   Willette ClusterPaula Horris Speros , NP 02/07/2017, 11:29 AM   Addendum: Reviewed and agree with initial management. I symptoms fail  to improve after adequate fissure treatment and resolution of perianal herpes, then I recommend proceeding to colonoscopy Pyrtle, Carie Caddy, MD

## 2017-02-13 DIAGNOSIS — E039 Hypothyroidism, unspecified: Secondary | ICD-10-CM | POA: Diagnosis not present

## 2017-02-13 DIAGNOSIS — R140 Abdominal distension (gaseous): Secondary | ICD-10-CM | POA: Diagnosis not present

## 2017-02-27 ENCOUNTER — Telehealth: Payer: Self-pay | Admitting: Internal Medicine

## 2017-02-27 NOTE — Telephone Encounter (Signed)
ROI fax to [facility]

## 2017-02-28 ENCOUNTER — Telehealth: Payer: Self-pay | Admitting: Internal Medicine

## 2017-03-03 NOTE — Telephone Encounter (Signed)
Pt states she started having the symptoms again like the anal fissure. She started using the diltiazem gel again but wants to know if she needs to be seen sooner than scheduled appt. Discussed with pt to keep her scheduled appt and continue the gel and citrucel and let Dr. Rhea BeltonPyrtle exam her and go from there. Pt knows to continue with tx plan and keep OV as scheduled.

## 2017-03-03 NOTE — Telephone Encounter (Signed)
Left message for pt to call back  °

## 2017-03-05 ENCOUNTER — Ambulatory Visit: Payer: Federal, State, Local not specified - PPO | Admitting: Internal Medicine

## 2017-03-12 DIAGNOSIS — K080 Exfoliation of teeth due to systemic causes: Secondary | ICD-10-CM | POA: Diagnosis not present

## 2017-03-20 ENCOUNTER — Ambulatory Visit: Payer: Federal, State, Local not specified - PPO | Admitting: Internal Medicine

## 2017-03-27 ENCOUNTER — Encounter: Payer: Self-pay | Admitting: *Deleted

## 2017-04-02 ENCOUNTER — Encounter: Payer: Self-pay | Admitting: Internal Medicine

## 2017-04-02 ENCOUNTER — Ambulatory Visit (INDEPENDENT_AMBULATORY_CARE_PROVIDER_SITE_OTHER): Payer: Federal, State, Local not specified - PPO | Admitting: Internal Medicine

## 2017-04-02 VITALS — BP 104/70 | HR 64 | Ht 65.0 in | Wt 123.4 lb

## 2017-04-02 DIAGNOSIS — R140 Abdominal distension (gaseous): Secondary | ICD-10-CM

## 2017-04-02 DIAGNOSIS — K5900 Constipation, unspecified: Secondary | ICD-10-CM | POA: Diagnosis not present

## 2017-04-02 DIAGNOSIS — Z8719 Personal history of other diseases of the digestive system: Secondary | ICD-10-CM

## 2017-04-02 DIAGNOSIS — K589 Irritable bowel syndrome without diarrhea: Secondary | ICD-10-CM | POA: Diagnosis not present

## 2017-04-02 DIAGNOSIS — R194 Change in bowel habit: Secondary | ICD-10-CM

## 2017-04-02 DIAGNOSIS — A601 Herpesviral infection of perianal skin and rectum: Secondary | ICD-10-CM

## 2017-04-02 MED ORDER — SUPREP BOWEL PREP KIT 17.5-3.13-1.6 GM/177ML PO SOLN: ORAL | 0 refills | Status: DC

## 2017-04-02 NOTE — Patient Instructions (Signed)
You have been scheduled for a colonoscopy. Please follow written instructions given to you at your visit today.  Please pick up your prep supplies at the pharmacy within the next 1-3 days. If you use inhalers (even only as needed), please bring them with you on the day of your procedure. Your physician has requested that you go to www.startemmi.com and enter the access code given to you at your visit today. This web site gives a general overview about your procedure. However, you should still follow specific instructions given to you by our office regarding your preparation for the procedure.  We have given you a copy of the FODMAP diet to look over and follow.  Please purchase the following medications over the counter and take as directed: Benefiber:  Take 1 Tablespoon  We have given you some samples of VSL#3. These can be purchased over the counter at the pharmacy.  If you are age 35 or older, your body mass index should be between 23-30. Your Body mass index is 20.53 kg/m. If this is out of the aforementioned range listed, please consider follow up with your Primary Care Provider.  If you are age 35 or younger, your body mass index should be between 19-25. Your Body mass index is 20.53 kg/m. If this is out of the aformentioned range listed, please consider follow up with your Primary Care Provider.

## 2017-04-02 NOTE — Progress Notes (Signed)
Subjective:    Patient ID: Molly Castro, female    DOB: [birth year], 36 y.o.   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 She was seen on 02/07/2017 by Willette ClusterPaula Guenther, NP.  She reports that in May she developed anal pressure, pain with defecation as well as an ulcerative lesion near her anus. This all started about 1 week after laser hair removal procedure. She was seen by Dr. Johna SheriffHoxworth with general surgery but also by her gynecologist. She reports initially treating what she felt to be hemorrhoids with suppository which she later discovered this was likely worsening fissure. Dr. Johna SheriffHoxworth started diltiazem gel and she was later treated by her gynecologist with Valtrex for HSV 1. Her anal fissure symptoms have significantly improved and her perianal ulcer has healed completely.  With this she is still feeling minor though improved anal/rectal pressure. She reports feeling incomplete evacuation but no abdominal pain. She can feel uncomfortable discomfort in her abdomen if she lies on her stomach. She has been told that she may have been constipated and so she was using MiraLAX, Colace and Citrucel. MiraLAX made her stools too loose at times. She has since for the last 2 weeks stopped all medications basically to see what her bowel habits would be. She is having a bowel movement most days occasionally requiring straining. Some very minor occasional blood with wiping but not in stool or in the toilet. The stinging with passing bowel movement has resolved entirely but after bowel movement she can have less than a minute of stinging discomfort near her anus. She has some occasional nocturnal perianal itching.  Her abdominal bloating has improved she is taking a daily probiotic which she buys from [employer].  In the past year she has been diagnosed with thyroid  disease dramatically changed her diet trying to eat healthier more raw fruits and vegetables and also started a regular workout program.  She's had 2 prior kids by C-section and her youngest is 35 years old  Review of Systems As per HPI, otherwise negative  Current Medications, Allergies, Past Medical History, Past Surgical History, Family History and Social History were reviewed in [facility] electronic medical record.     Objective:   Physical Exam BP 104/70 (BP Location: Left Arm, Patient Position: Sitting, Cuff Size: Normal)   Pulse 64   Ht 5\' 5"  (1.651 m)   Wt 123 lb 6.4 oz (56 kg)   SpO2 97%   BMI 20.53 kg/m  Constitutional: Well-developed and well-nourished. No distress. HEENT: Normocephalic and atraumatic.   Conjunctivae are normal.  No scleral icterus. Neck: Neck supple. Trachea midline. Cardiovascular: Normal rate, regular rhythm and intact distal pulses. No M/R/G Pulmonary/chest: Effort normal and breath sounds normal. No wheezing, rales or rhonchi. Abdominal: Soft, nontender, nondistended. Bowel sounds active throughout. There are no masses palpable. No hepatosplenomegaly.  Cesarean section incision well healed with minor nodularity on the left lateral edge of the incision which is slightly tender Extremities: no clubbing, cyanosis, or edema Neurological: Alert and oriented to person place and time. Skin: Skin is warm and dry. Psychiatric: Normal mood and affect. Behavior is normal.     Assessment & Plan:  36 year old female with a family history of colon cancer, recent history of anal fissure complicated by perianal HSV infection, who is here for follow-up  1. Anal fissure -- treated  with diltiazem gel and the symptoms appear to have healed. See number 2 and 3.  2. Possible constipation/abdominal bloating/possible IBS -- given her recent symptoms which are likely from several different diagnoses (fissure, perianal herpes, possible constipation) along with her  family history of colon cancer I recommended that we proceed to colonoscopy to exclude colonic pathology contributing to her recent symptoms. We discussed the risks, benefits and alternatives and she is agreeable and wishes to proceed. If normal I think this will be very reassuring to her. --I have also recommended that she start Benefiber 1 tablespoon daily. --I recommended the FODMAP diet and we reviewed this today. Some of her dietary changes in the last year may be contributing to her abdominal symptoms --When she completes her probiotic at home she will change to VSL 3, 1 capsule daily  3. 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 She has a prescription on hand provided by gynecology for acyclovir should future outbreak occur.  25 minutes spent with the patient today. Greater than 50% was spent in counseling and coordination of care with the patient

## 2017-04-10 ENCOUNTER — Encounter: Payer: Federal, State, Local not specified - PPO | Admitting: Internal Medicine

## 2017-04-11 DIAGNOSIS — D802 Selective deficiency of immunoglobulin A [IgA]: Secondary | ICD-10-CM | POA: Diagnosis not present

## 2017-04-11 DIAGNOSIS — R140 Abdominal distension (gaseous): Secondary | ICD-10-CM | POA: Diagnosis not present

## 2017-04-11 DIAGNOSIS — M2550 Pain in unspecified joint: Secondary | ICD-10-CM | POA: Diagnosis not present

## 2017-04-11 DIAGNOSIS — R5383 Other fatigue: Secondary | ICD-10-CM | POA: Diagnosis not present

## 2017-04-11 DIAGNOSIS — H15101 Unspecified episcleritis, right eye: Secondary | ICD-10-CM | POA: Diagnosis not present

## 2017-04-11 DIAGNOSIS — E039 Hypothyroidism, unspecified: Secondary | ICD-10-CM | POA: Diagnosis not present

## 2017-04-22 ENCOUNTER — Encounter: Payer: Self-pay | Admitting: Internal Medicine

## 2017-05-05 ENCOUNTER — Encounter: Payer: Self-pay | Admitting: Internal Medicine

## 2017-05-05 ENCOUNTER — Ambulatory Visit (AMBULATORY_SURGERY_CENTER): Payer: Federal, State, Local not specified - PPO | Admitting: Internal Medicine

## 2017-05-05 VITALS — BP 123/69 | HR 72 | Temp 97.8°F | Resp 39 | Ht 65.0 in | Wt 123.0 lb

## 2017-05-05 DIAGNOSIS — Z1212 Encounter for screening for malignant neoplasm of rectum: Secondary | ICD-10-CM

## 2017-05-05 DIAGNOSIS — Z1211 Encounter for screening for malignant neoplasm of colon: Secondary | ICD-10-CM | POA: Diagnosis not present

## 2017-05-05 DIAGNOSIS — K620 Anal polyp: Secondary | ICD-10-CM | POA: Diagnosis not present

## 2017-05-05 DIAGNOSIS — Z800 Family history of malignant neoplasm of digestive organs: Secondary | ICD-10-CM | POA: Diagnosis not present

## 2017-05-05 DIAGNOSIS — K6289 Other specified diseases of anus and rectum: Secondary | ICD-10-CM | POA: Diagnosis not present

## 2017-05-05 DIAGNOSIS — R194 Change in bowel habit: Secondary | ICD-10-CM | POA: Diagnosis not present

## 2017-05-05 MED ORDER — SODIUM CHLORIDE 0.9 % IV SOLN: 500.0000 mL | INTRAVENOUS | Status: DC

## 2017-05-05 NOTE — Progress Notes (Signed)
Pt's states no medical or surgical changes since previsit or office visit. 

## 2017-05-05 NOTE — Patient Instructions (Signed)
Handout given: Polyps.  Continue your medications.  Repeat Colonoscopy in 5 years.   YOU HAD AN ENDOSCOPIC PROCEDURE TODAY AT THE [facility]:   Refer to the procedure report that was given to you for any specific questions about what was found during the examination.  If the procedure report does not answer your questions, please call your gastroenterologist to clarify.  If you requested that your care partner not be given the details of your procedure findings, then the procedure report has been included in a sealed envelope for you to review at your convenience later.  YOU SHOULD EXPECT: Some feelings of bloating in the abdomen. Passage of more gas than usual.  Walking can help get rid of the air that was put into your GI tract during the procedure and reduce the bloating. If you had a lower endoscopy (such as a colonoscopy or flexible sigmoidoscopy) you may notice spotting of blood in your stool or on the toilet paper. If you underwent a bowel prep for your procedure, you may not have a normal bowel movement for a few days.  Please Note:  You might notice some irritation and congestion in your nose or some drainage.  This is from the oxygen used during your procedure.  There is no need for concern and it should clear up in a day or so.  SYMPTOMS TO REPORT IMMEDIATELY:   Following lower endoscopy (colonoscopy or flexible sigmoidoscopy):  Excessive amounts of blood in the stool  Significant tenderness or worsening of abdominal pains  Swelling of the abdomen that is new, acute  Fever of 100F or higher   For urgent or emergent issues, a gastroenterologist can be reached at any hour by calling [phone].   DIET:  We do recommend a small meal at first, but then you may proceed to your regular diet.  Drink plenty of fluids but you should avoid alcoholic beverages for 24 hours.  ACTIVITY:  You should plan to take it easy for the rest of today and you should NOT DRIVE or use  heavy machinery until tomorrow (because of the sedation medicines used during the test).    FOLLOW UP: Our staff will call the number listed on your records the next business day following your procedure to check on you and address any questions or concerns that you may have regarding the information given to you following your procedure. If we do not reach you, we will leave a message.  However, if you are feeling well and you are not experiencing any problems, there is no need to return our call.  We will assume that you have returned to your regular daily activities without incident.  If any biopsies were taken you will be contacted by phone or by letter within the next 1-3 weeks.  Please call us at [phone] if you have not heard about the biopsies in 3 weeks.    SIGNATURES/CONFIDENTIALITY: You and/or your care partner have signed paperwork which will be entered into your electronic medical record.  These signatures attest to the fact that that the information above on your After Visit Summary has been reviewed and is understood.  Full responsibility of the confidentiality of this discharge information lies with you and/or your care-partner.

## 2017-05-05 NOTE — Progress Notes (Signed)
Called to room to assist during endoscopic procedure.  Patient ID and intended procedure confirmed with present staff. Received instructions for my participation in the procedure from the performing physician.  

## 2017-05-05 NOTE — Progress Notes (Signed)
Darlyn Read, RN went over discharge instructions with pt and her husband.  maw

## 2017-05-05 NOTE — Progress Notes (Signed)
Report to PACU, RN, vss, BBS= Clear.  

## 2017-05-05 NOTE — Progress Notes (Signed)
No problems noted in the recovery room. maw 

## 2017-05-05 NOTE — Op Note (Signed)
[facility] Patient Name: Molly Castro Procedure Date: 05/05/2017 3:52 PM MRN: [id number] Endoscopist: Beverley Fiedler , MD Age: 36 Referring MD:  Date of Birth: 1980-10-15 Gender: Female Account #: [id number] Procedure:                Colonoscopy Indications:              Screening in patient at increased risk: Family                            history of 1st-degree relative with colorectal                            cancer before age 47 years, This is the patient's                            first colonoscopy Medicines:                Monitored Anesthesia Care Procedure:                Pre-Anesthesia Assessment:                           - Prior to the procedure, a History and Physical                            was performed, and patient medications and                            allergies were reviewed. The patient's tolerance of                            previous anesthesia was also reviewed. The risks                            and benefits of the procedure and the sedation                            options and risks were discussed with the patient.                            All questions were answered, and informed consent                            was obtained. Prior Anticoagulants: The patient has                            taken no previous anticoagulant or antiplatelet                            agents. ASA Grade Assessment: II - A patient with                            mild systemic disease. After reviewing the risks  and benefits, the patient was deemed in                            satisfactory condition to undergo the procedure.                           After obtaining informed consent, the colonoscope                            was passed under direct vision. Throughout the                            procedure, the patient's blood pressure, pulse, and                            oxygen saturations were monitored continuously. The                             Model PCF-[id number]) scope was introduced                            through the anus and advanced to the the terminal                            ileum. The colonoscopy was performed without                            difficulty. The patient tolerated the procedure                            well. The quality of the bowel preparation was                            good. The terminal ileum, ileocecal valve,                            appendiceal orifice, and rectum were photographed. Scope In: 4:01:49 PM Scope Out: 4:20:40 PM Scope Withdrawal Time: 0 hours 12 minutes 17 seconds  Total Procedure Duration: 0 hours 18 minutes 51 seconds  Findings:                 The perianal exam findings include a skin tag.                           The terminal ileum appeared normal.                           Normal mucosa was found in the entire colon. No                            colon polyps seen.                           Internal hemorrhoids were found during  retroflexion. The hemorrhoids were small.                           A 7 mm polypoid lesion was found at the anus. The                            lesion was semi-pedunculated. No bleeding was                            present. Two biopsies were obtained with cold                            forceps for histology in a targeted manner (rule                            out skin tag versus anal intraepithelial neoplasia). Complications:            No immediate complications. Estimated Blood Loss:     Estimated blood loss: none. Impression:               - The examined portion of the ileum was normal.                           - Normal mucosa in the entire examined colon.                           - Small internal hemorrhoids.                           - Polypoid lesion at the anus. Biopsied. Recommendation:           - Patient has a contact number available for                             emergencies. The signs and symptoms of potential                            delayed complications were discussed with the                            patient. Return to normal activities tomorrow.                            Written discharge instructions were provided to the                            patient.                           - Resume previous diet.                           - Continue present medications including VSL #3.                           - Await pathology results.                           -  Repeat colonoscopy in 5 years for screening                            purposes. Beverley Fiedler, MD 05/05/2017 4:27:32 PM This report has been signed electronically.

## 2017-05-06 ENCOUNTER — Telehealth: Payer: Self-pay | Admitting: *Deleted

## 2017-05-06 NOTE — Telephone Encounter (Signed)
  Follow up Call-  Call back number 05/05/2017  Post procedure Call Back phone  # [phone]  Permission to leave phone message Yes  Some recent data might be hidden     Patient questions:  Do you have a fever, pain , or abdominal swelling? No. Pain Score  0 *  Have you tolerated food without any problems? Yes.    Have you been able to return to your normal activities? Yes.    Do you have any questions about your discharge instructions: Diet   No. Medications  No. Follow up visit  No.  Do you have questions or concerns about your Care? No.  Actions: * If pain score is 4 or above: No action needed, pain <4.

## 2017-05-09 ENCOUNTER — Encounter: Payer: Self-pay | Admitting: Internal Medicine

## 2017-05-09 ENCOUNTER — Telehealth: Payer: Self-pay | Admitting: Internal Medicine

## 2017-05-09 NOTE — Telephone Encounter (Signed)
All additional questions about pathology answered

## 2017-05-20 ENCOUNTER — Telehealth: Payer: Self-pay

## 2017-05-20 NOTE — Telephone Encounter (Signed)
Pt scheduled to see [facility] Dr. Maisie Fushomas 06/03/17@11 :20am, pt to arrive there at 10:50am.

## 2017-05-29 DIAGNOSIS — E039 Hypothyroidism, unspecified: Secondary | ICD-10-CM | POA: Diagnosis not present

## 2017-06-13 ENCOUNTER — Ambulatory Visit: Payer: Self-pay | Admitting: General Surgery

## 2017-06-13 ENCOUNTER — Other Ambulatory Visit: Payer: Self-pay | Admitting: Physician Assistant

## 2017-06-13 DIAGNOSIS — Z1231 Encounter for screening mammogram for malignant neoplasm of breast: Secondary | ICD-10-CM

## 2017-06-13 DIAGNOSIS — K620 Anal polyp: Secondary | ICD-10-CM | POA: Diagnosis not present

## 2017-06-13 NOTE — H&P (Signed)
History of Present Illness Romie Levee MD; 06/13/2017 11:47 AM) The patient is a 36 year old female who presents with a complaint of anal problems. 36 year old female who presents to the office after treatment of an anal fissure with diltiazem. Once this was healed, she was taken for colonoscopy by Dr. Sharla Kidney. An anal skin tag was biopsied and showed signs of hyperplasia but there was concern for some condylomatous changes. She is here today for evaluation. She states that she is having better bowel function but continues to have some pain with bowel movements. She denies any more bleeding. She is trying to find a bowel regimen that works for her consistently.   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 Liothyronine Sodium ( Tablet, Oral) Active. NITROGLYCERIN CREAM (2% Cream, see note Cream External four times daily, Taken starting 02/03/2017) Active. (Apply to rectum) Levothyroxine Sodium ( Tablet, Oral) Active. Nortrel 0.5/35 (28) (0.5-35MG -MCG Tablet, Oral) Active. Medications Reconciled Hydrocortisone Acetate (  Suppository, 1 (one) Suppository Rectal two times daily, as needed, Taken starting 06/13/2017) Active.  Social History Romie Levee, MD; 06/13/2017 12:26 PM) Alcohol use Moderate alcohol use. No caffeine use No drug use Tobacco use Never smoker.  Family History Romie Levee, MD; 06/13/2017 12:26 PM) Alcohol Abuse Mother. Bleeding  disorder Mother. Colon Cancer Mother.  Pregnancy / Birth History Romie Levee, MD; 06/13/2017 12:26 PM) Age at menarche 17 years. Contraceptive History Oral contraceptives. Gravida 2 Irregular periods Length (months) of breastfeeding 12-24 Maternal age 53-30 Para 2  Other Problems Romie Levee, MD; 06/13/2017 12:26 PM) Back Pain Gastroesophageal Reflux Disease Hemorrhoids Thyroid Disease     Review of Systems Romie Levee MD; 06/13/2017 12:26 PM) General Present- Fatigue. Not Present- Appetite Loss, Chills, Fever, Night Sweats, Weight Gain and Weight Loss. Skin Present- New Lesions. Not Present- Change in Wart/Mole, Dryness, Hives, Jaundice, Non-Healing Wounds, Rash and Ulcer. HEENT Present- Seasonal Allergies. Not Present- Earache, Hearing Loss, Hoarseness, Nose Bleed, Oral Ulcers, Ringing in the Ears, Sinus Pain, Sore Throat, Visual Disturbances, Wears glasses/contact lenses and Yellow Eyes. Gastrointestinal Present- Abdominal Pain, Bloating, Gets full quickly at meals, Hemorrhoids and Rectal Pain. Not Present- Bloody Stool, Change in Bowel Habits, Chronic diarrhea, Constipation, Difficulty Swallowing, Excessive gas, Indigestion, Nausea and Vomiting. Female Genitourinary Present- Pelvic Pain. Not Present- Frequency, Nocturia, Painful Urination and Urgency. Musculoskeletal Present- Back Pain. Not Present- Joint Pain, Joint Stiffness, Muscle Pain, Muscle Weakness and Swelling of Extremities. Endocrine Present- Cold Intolerance and Hair Changes. Not Present- Excessive Hunger, Heat Intolerance, Hot flashes and New Diabetes.  Vitals Jessie Foot CMA; 06/13/2017 11:27 AM) 06/13/2017 11:26 AM Weight: 125.8 lb Height: 65in Body Surface Area: 1.62 m Body Mass Index: 20.93 kg/m  Temp.: 26F  Pulse: 97 (Regular)  BP: 110/68 (Sitting, Left Arm, Standard)      Physical Exam Romie Levee MD; 06/13/2017 12:26 PM)  General Mental Status-Alert. General  Appearance-Not in acute distress. Build & Nutrition-Well nourished. Posture-Normal posture. Gait-Normal.  Head and Neck Head-normocephalic, atraumatic with no lesions or palpable masses. Trachea-midline.  Chest and Lung Exam Chest and lung exam reveals -on auscultation,  normal breath sounds, no adventitious sounds and normal vocal resonance.  Cardiovascular Cardiovascular examination reveals -normal heart sounds, regular rate and rhythm with no murmurs and no digital clubbing, cyanosis, edema, increased warmth or tenderness.  Abdomen Inspection Inspection of the abdomen reveals - No Hernias. Palpation/Percussion Palpation and Percussion of the abdomen reveal - Soft, Non Tender, No Rigidity (guarding), No hepatosplenomegaly and No Palpable abdominal masses.  Rectal Anorectal Exam External - skin tag. Internal - normal sphincter tone and generalized tenderness.  Neurologic Neurologic evaluation reveals -alert and oriented x 3 with no impairment of recent or remote memory, normal attention span and ability to concentrate, normal sensation and normal coordination.  Musculoskeletal Normal Exam - Bilateral-Upper Extremity Strength Normal and Lower Extremity Strength Normal.   Results Romie Levee MD; 06/13/2017 11:52 AM) Procedures  Name Value Date ANOSCOPY, DIAGNOSTIC (96045) [ Hemorrhoids ] Procedure Other: Procedure: Anoscopy Surgeon: Maisie Fus After the risks and benefits were explained, verbal consent was obtained for above procedure. A medical assistant chaperone was present thoroughout the entire procedure. Anesthesia: none Diagnosis: anal polyp Findings: Large polyp proximal to the dentate line anteriorly. Grade 1 internal hemorrhoids with significant anal canal inflammation.  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 Biopsies show possible condylomatous changes. I have evaluated her and the polyp appears to be at the anal verge in the anterior rectum. I think that this could be easily resected in the operating room and the patient does wish for this to be resected due to the abnormal findings on biopsy. We discussed that there is a high likelihood that this will be completely benign. Risk include bleeding and pain. She does have quite a bit of inflammation in the anal canal and I have recommended that she use a steroid suppository when necessary. I see no signs of a recurrent fissure.

## 2017-07-04 ENCOUNTER — Ambulatory Visit
Admission: RE | Admit: 2017-07-04 | Discharge: 2017-07-04 | Disposition: A | Discharge status: Home / Self Care | Payer: Federal, State, Local not specified - PPO | Source: Ambulatory Visit | Attending: Physician Assistant | Admitting: Physician Assistant

## 2017-07-04 DIAGNOSIS — H04129 Dry eye syndrome of unspecified lacrimal gland: Secondary | ICD-10-CM | POA: Diagnosis not present

## 2017-07-04 DIAGNOSIS — Z1231 Encounter for screening mammogram for malignant neoplasm of breast: Secondary | ICD-10-CM

## 2017-07-04 DIAGNOSIS — N898 Other specified noninflammatory disorders of vagina: Secondary | ICD-10-CM | POA: Diagnosis not present

## 2017-07-04 DIAGNOSIS — D802 Selective deficiency of immunoglobulin A [IgA]: Secondary | ICD-10-CM | POA: Diagnosis not present

## 2017-07-04 DIAGNOSIS — E039 Hypothyroidism, unspecified: Secondary | ICD-10-CM | POA: Diagnosis not present

## 2017-08-26 ENCOUNTER — Encounter (HOSPITAL_BASED_OUTPATIENT_CLINIC_OR_DEPARTMENT_OTHER): Payer: Self-pay | Admitting: *Deleted

## 2017-08-27 ENCOUNTER — Other Ambulatory Visit: Payer: Self-pay

## 2017-08-27 ENCOUNTER — Encounter (HOSPITAL_BASED_OUTPATIENT_CLINIC_OR_DEPARTMENT_OTHER): Payer: Self-pay | Admitting: *Deleted

## 2017-08-27 NOTE — Progress Notes (Signed)
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 Needs urine preg and hemaglobin

## 2017-08-28 ENCOUNTER — Ambulatory Visit (HOSPITAL_BASED_OUTPATIENT_CLINIC_OR_DEPARTMENT_OTHER): Payer: Federal, State, Local not specified - PPO | Admitting: Anesthesiology

## 2017-08-28 ENCOUNTER — Ambulatory Visit (HOSPITAL_BASED_OUTPATIENT_CLINIC_OR_DEPARTMENT_OTHER)
Admission: RE | Admit: 2017-08-28 | Discharge: 2017-08-28 | Disposition: A | Discharge status: Home / Self Care | Payer: Federal, State, Local not specified - PPO | Source: Ambulatory Visit | Attending: General Surgery | Admitting: General Surgery

## 2017-08-28 ENCOUNTER — Encounter (HOSPITAL_BASED_OUTPATIENT_CLINIC_OR_DEPARTMENT_OTHER): Payer: Self-pay

## 2017-08-28 ENCOUNTER — Encounter (HOSPITAL_BASED_OUTPATIENT_CLINIC_OR_DEPARTMENT_OTHER)
Admission: RE | Disposition: A | Discharge status: Home / Self Care | Payer: Self-pay | Source: Ambulatory Visit | Attending: General Surgery

## 2017-08-28 DIAGNOSIS — K644 Residual hemorrhoidal skin tags: Secondary | ICD-10-CM | POA: Diagnosis not present

## 2017-08-28 DIAGNOSIS — K219 Gastro-esophageal reflux disease without esophagitis: Secondary | ICD-10-CM | POA: Insufficient documentation

## 2017-08-28 DIAGNOSIS — Z79899 Other long term (current) drug therapy: Secondary | ICD-10-CM | POA: Insufficient documentation

## 2017-08-28 DIAGNOSIS — K640 First degree hemorrhoids: Secondary | ICD-10-CM | POA: Diagnosis not present

## 2017-08-28 DIAGNOSIS — K620 Anal polyp: Secondary | ICD-10-CM | POA: Insufficient documentation

## 2017-08-28 DIAGNOSIS — K602 Anal fissure, unspecified: Secondary | ICD-10-CM | POA: Insufficient documentation

## 2017-08-28 DIAGNOSIS — K600 Acute anal fissure: Secondary | ICD-10-CM | POA: Diagnosis not present

## 2017-08-28 DIAGNOSIS — L918 Other hypertrophic disorders of the skin: Secondary | ICD-10-CM | POA: Diagnosis not present

## 2017-08-28 HISTORY — DX: Family history of malignant neoplasm of digestive organs: Z80.0

## 2017-08-28 HISTORY — DX: Herpesviral infection, unspecified: B00.9

## 2017-08-28 HISTORY — DX: Personal history of other diseases of the digestive system: Z87.19

## 2017-08-28 HISTORY — DX: Anal polyp: K62.0

## 2017-08-28 LAB — POCT PREGNANCY, URINE: Preg Test, Ur: NEGATIVE

## 2017-08-28 LAB — POCT HEMOGLOBIN-HEMACUE: Hemoglobin: 12.7 g/dL (ref 12.0–15.0)

## 2017-08-28 SURGERY — EXAM UNDER ANESTHESIA
Anesthesia: Monitor Anesthesia Care

## 2017-08-28 MED ORDER — LIDOCAINE 2% (20 MG/ML) 5 ML SYRINGE
INTRAMUSCULAR | Status: AC
  Filled 2017-08-28: qty 5

## 2017-08-28 MED ORDER — SODIUM CHLORIDE 0.9 % IV SOLN
250.0000 mL | INTRAVENOUS | Status: DC | PRN
  Filled 2017-08-28: qty 250

## 2017-08-28 MED ORDER — PROPOFOL 500 MG/50ML IV EMUL
INTRAVENOUS | Status: AC
Start: 2017-08-28 — End: 2017-08-28
  Filled 2017-08-28: qty 50

## 2017-08-28 MED ORDER — ACETAMINOPHEN 325 MG PO TABS
650.0000 mg | ORAL_TABLET | ORAL | Status: DC | PRN
  Filled 2017-08-28: qty 2

## 2017-08-28 MED ORDER — BUPIVACAINE-EPINEPHRINE 0.5% -1:200000 IJ SOLN
INTRAMUSCULAR | Status: DC | PRN
Start: 2017-08-28 — End: 2017-08-28
  Administered 2017-08-28: 30 mL

## 2017-08-28 MED ORDER — ONDANSETRON HCL 4 MG/2ML IJ SOLN
INTRAMUSCULAR | Status: AC
  Filled 2017-08-28: qty 2

## 2017-08-28 MED ORDER — MIDAZOLAM HCL 2 MG/2ML IJ SOLN
INTRAMUSCULAR | Status: AC
  Filled 2017-08-28: qty 2

## 2017-08-28 MED ORDER — OXYCODONE HCL 5 MG PO TABS
5.0000 mg | ORAL_TABLET | Freq: Once | ORAL | Status: DC | PRN
  Filled 2017-08-28: qty 1

## 2017-08-28 MED ORDER — SODIUM CHLORIDE 0.9 % IV SOLN
INTRAVENOUS | Status: DC | PRN
  Administered 2017-08-28: 20 ug/kg/min via INTRAVENOUS

## 2017-08-28 MED ORDER — ACETAMINOPHEN 650 MG RE SUPP
650.0000 mg | RECTAL | Status: DC | PRN
  Filled 2017-08-28: qty 1

## 2017-08-28 MED ORDER — OXYCODONE HCL 5 MG/5ML PO SOLN
5.0000 mg | Freq: Once | ORAL | Status: DC | PRN
  Filled 2017-08-28: qty 5

## 2017-08-28 MED ORDER — KETAMINE HCL 10 MG/ML IJ SOLN
INTRAMUSCULAR | Status: DC | PRN
  Administered 2017-08-28: 20 mg via INTRAVENOUS

## 2017-08-28 MED ORDER — PROPOFOL 500 MG/50ML IV EMUL
INTRAVENOUS | Status: DC | PRN
  Administered 2017-08-28: 200 ug/kg/min via INTRAVENOUS

## 2017-08-28 MED ORDER — BUPIVACAINE LIPOSOME 1.3 % IJ SUSP
INTRAMUSCULAR | Status: DC | PRN
  Administered 2017-08-28: 20 mL

## 2017-08-28 MED ORDER — LIDOCAINE 5 % EX OINT
TOPICAL_OINTMENT | CUTANEOUS | Status: DC | PRN
  Administered 2017-08-28: 1

## 2017-08-28 MED ORDER — LACTATED RINGERS IV SOLN
INTRAVENOUS | Status: DC
  Administered 2017-08-28: 09:00:00 via INTRAVENOUS
  Filled 2017-08-28: qty 1000

## 2017-08-28 MED ORDER — SODIUM CHLORIDE 0.9% FLUSH
3.0000 mL | INTRAVENOUS | Status: DC | PRN
  Filled 2017-08-28: qty 3

## 2017-08-28 MED ORDER — DEXAMETHASONE SODIUM PHOSPHATE 10 MG/ML IJ SOLN
INTRAMUSCULAR | Status: AC
  Filled 2017-08-28: qty 1

## 2017-08-28 MED ORDER — OXYCODONE HCL 5 MG PO TABS
5.0000 mg | ORAL_TABLET | ORAL | Status: DC | PRN
  Filled 2017-08-28: qty 2

## 2017-08-28 MED ORDER — MIDAZOLAM HCL 5 MG/5ML IJ SOLN
INTRAMUSCULAR | Status: DC | PRN
  Administered 2017-08-28: 2 mg via INTRAVENOUS

## 2017-08-28 MED ORDER — ONDANSETRON HCL 4 MG/2ML IJ SOLN
4.0000 mg | Freq: Four times a day (QID) | INTRAMUSCULAR | Status: AC | PRN
  Administered 2017-08-28: 4 mg via INTRAVENOUS
  Filled 2017-08-28: qty 2

## 2017-08-28 MED ORDER — HYDROCODONE-ACETAMINOPHEN 5-325 MG PO TABS: 1.0000 | ORAL_TABLET | ORAL | 0 refills | Status: DC | PRN

## 2017-08-28 MED ORDER — FENTANYL CITRATE (PF) 100 MCG/2ML IJ SOLN
25.0000 ug | INTRAMUSCULAR | Status: DC | PRN
  Filled 2017-08-28: qty 1

## 2017-08-28 MED ORDER — DEXAMETHASONE SODIUM PHOSPHATE 4 MG/ML IJ SOLN
INTRAMUSCULAR | Status: DC | PRN
  Administered 2017-08-28: 5 mg via INTRAVENOUS

## 2017-08-28 MED ORDER — KETAMINE HCL-SODIUM CHLORIDE 100-0.9 MG/10ML-% IV SOSY
PREFILLED_SYRINGE | INTRAVENOUS | Status: AC
  Filled 2017-08-28: qty 10

## 2017-08-28 MED ORDER — SODIUM CHLORIDE 0.9% FLUSH
3.0000 mL | Freq: Two times a day (BID) | INTRAVENOUS | Status: DC
  Filled 2017-08-28: qty 3

## 2017-08-28 SURGICAL SUPPLY — 51 items
BENZOIN TINCTURE PRP APPL 2/3 (GAUZE/BANDAGES/DRESSINGS) ×4 IMPLANT
BLADE HEX COATED 2.75 (ELECTRODE) ×2 IMPLANT
BLADE SURG 10 STRL SS (BLADE) IMPLANT
BLADE SURG 15 STRL LF DISP TIS (BLADE) IMPLANT
BLADE SURG 15 STRL SS (BLADE)
BRIEF STRETCH FOR OB PAD LRG (UNDERPADS AND DIAPERS) ×4 IMPLANT
CANISTER SUCT 3000ML PPV (MISCELLANEOUS) ×2 IMPLANT
COVER BACK TABLE 60X90IN (DRAPES) ×2 IMPLANT
COVER MAYO STAND STRL (DRAPES) ×2 IMPLANT
DECANTER SPIKE VIAL GLASS SM (MISCELLANEOUS) ×2 IMPLANT
DRAPE LAPAROTOMY 100X72 PEDS (DRAPES) ×2 IMPLANT
DRAPE UTILITY XL STRL (DRAPES) ×2 IMPLANT
ELECT BLADE 6.5 .24CM SHAFT (ELECTRODE) IMPLANT
ELECT REM PT RETURN 9FT ADLT (ELECTROSURGICAL) ×2
ELECTRODE REM PT RTRN 9FT ADLT (ELECTROSURGICAL) ×1 IMPLANT
GAUZE SPONGE 4X4 12PLY STRL LF (GAUZE/BANDAGES/DRESSINGS) ×2 IMPLANT
GAUZE SPONGE 4X4 16PLY XRAY LF (GAUZE/BANDAGES/DRESSINGS) IMPLANT
GLOVE BIO SURGEON STRL SZ 6.5 (GLOVE) ×2 IMPLANT
GLOVE INDICATOR 7.0 STRL GRN (GLOVE) ×2 IMPLANT
GOWN SPEC L3 XXLG W/TWL (GOWN DISPOSABLE) ×2 IMPLANT
HYDROGEN PEROXIDE 16OZ (MISCELLANEOUS) ×2 IMPLANT
KIT RM TURNOVER CYSTO AR (KITS) ×2 IMPLANT
LOOP VESSEL MAXI BLUE (MISCELLANEOUS) IMPLANT
NDL SAFETY ECLIPSE 18X1.5 (NEEDLE) IMPLANT
NEEDLE HYPO 18GX1.5 SHARP (NEEDLE)
NEEDLE HYPO 22GX1.5 SAFETY (NEEDLE) ×2 IMPLANT
NS IRRIG 500ML POUR BTL (IV SOLUTION) ×2 IMPLANT
PACK BASIN DAY SURGERY FS (CUSTOM PROCEDURE TRAY) ×2 IMPLANT
PAD ABD 8X10 STRL (GAUZE/BANDAGES/DRESSINGS) ×2 IMPLANT
PAD ARMBOARD 7.5X6 YLW CONV (MISCELLANEOUS) IMPLANT
PENCIL BUTTON HOLSTER BLD 10FT (ELECTRODE) ×2 IMPLANT
SPONGE GAUZE 4X4 12PLY STER LF (GAUZE/BANDAGES/DRESSINGS) ×2 IMPLANT
SPONGE HEMORRHOID 8X3CM (HEMOSTASIS) IMPLANT
SPONGE SURGIFOAM ABS GEL 12-7 (HEMOSTASIS) IMPLANT
SUCTION FRAZIER HANDLE 10FR (MISCELLANEOUS)
SUCTION TUBE FRAZIER 10FR DISP (MISCELLANEOUS) IMPLANT
SUT CHROMIC 2 0 SH (SUTURE) IMPLANT
SUT CHROMIC 3 0 SH 27 (SUTURE) IMPLANT
SUT ETHIBOND 0 (SUTURE) IMPLANT
SUT SILK 2 0 (SUTURE)
SUT SILK 2-0 18XBRD TIE 12 (SUTURE) IMPLANT
SUT VIC AB 2-0 SH 27 (SUTURE)
SUT VIC AB 2-0 SH 27XBRD (SUTURE) IMPLANT
SUT VIC AB 3-0 SH 18 (SUTURE) IMPLANT
SUT VIC AB 4-0 P-3 18XBRD (SUTURE) IMPLANT
SUT VIC AB 4-0 P3 18 (SUTURE)
SYR CONTROL 10ML LL (SYRINGE) ×2 IMPLANT
TOWEL OR 17X24 6PK STRL BLUE (TOWEL DISPOSABLE) ×2 IMPLANT
TRAY DSU PREP LF (CUSTOM PROCEDURE TRAY) ×2 IMPLANT
TUBE CONNECTING 12X1/4 (SUCTIONS) ×2 IMPLANT
YANKAUER SUCT BULB TIP NO VENT (SUCTIONS) ×2 IMPLANT

## 2017-08-28 NOTE — Discharge Instructions (Addendum)
ANORECTAL SURGERY: POST OP INSTRUCTIONS °1. Take your usually prescribed home medications unless otherwise directed. °2. DIET: During the first few hours after surgery sip on some liquids until you are able to urinate.  It is normal to not urinate for several hours after this surgery.  If you feel uncomfortable, please contact the office for instructions.  After you are able to urinate,you may eat, if you feel like it.  Follow a light bland diet the first 24 hours after arrival home, such as soup, liquids, crackers, etc.  Be sure to include lots of fluids daily (6-8 glasses).  Avoid fast food or heavy meals, as your are more likely to get nauseated.  Eat a low fat diet the next few days after surgery.  Limit caffeine intake to 1-2 servings a day. °3. PAIN CONTROL: °a. Pain is best controlled by a usual combination of several different methods TOGETHER: °i. Muscle relaxation: Soak in a warm bath (or Sitz bath) three times a day and after bowel movements.  Continue to do this until all pain is resolved. °ii. Over the counter pain medication °iii. Prescription pain medication °b. Most patients will experience some swelling and discomfort in the anus/rectal area and incisions.  Heat such as warm towels, sitz baths, warm baths, etc to help relax tight/sore spots and speed recovery.  Some people prefer to use ice, especially in the first couple days after surgery, as it may decrease the pain and swelling, or alternate between ice & heat.  Experiment to what works for you.  Swelling and bruising can take several weeks to resolve.  Pain can take even longer to completely resolve. °c. It is helpful to take an over-the-counter pain medication regularly for the first few weeks.  Choose one of the following that works best for you: °i. Naproxen (Aleve, etc)  Two 220mg tabs twice a day °ii. Ibuprofen (Advil, etc) Three 200mg tabs four times a day (every meal & bedtime) °d. A  prescription for pain medication (such as percocet,  oxycodone, hydrocodone, etc) should be given to you upon discharge.  Take your pain medication as prescribed.  °i. If you are having problems/concerns with the prescription medicine (does not control pain, nausea, vomiting, rash, itching, etc), please call us [phone] to see if we need to switch you to a different pain medicine that will work better for you and/or control your side effect better. °ii. If you need a refill on your pain medication, please contact your pharmacy.  They will contact our office to request authorization. Prescriptions will not be filled after 5 pm or on week-ends. °4. KEEP YOUR BOWELS REGULAR and AVOID CONSTIPATION °a. The goal is one to two soft bowel movements a day.  You should at least have a bowel movement every other day. °b. Avoid getting constipated.  Between the surgery and the pain medications, it is common to experience some constipation. This can be very painful after rectal surgery.  Increasing fluid intake and taking a fiber supplement (such as Metamucil, Citrucel, FiberCon, etc) 1-2 times a day regularly will usually help prevent this problem from occurring.  A stool softener like colace is also recommended.  This can be purchased over the counter at your pharmacy.  You can take it up to 3 times a day.  If you do not have a bowel movement after 24 hrs since your surgery, take one does of milk of magnesia.  If you still haven't had a bowel movement 8-12 hours after   that dose, take another dose.  If you don't have a bowel movement 48 hrs after surgery, purchase a Fleets enema from the drug store and administer gently per package instructions.  If you still are having trouble with your bowel movements after that, please call the office for further instructions. °c. If you develop diarrhea or have many loose bowel movements, simplify your diet to bland foods & liquids for a few days.  Stop any stool softeners and decrease your fiber supplement.  Switching to mild  anti-diarrheal medications (Kayopectate, Pepto Bismol) can help.  If this worsens or does not improve, please call us. ° °5. Wound Care °a. Remove your bandages before your first bowel movement or 8 hours after surgery.     °b. Remove any wound packing material at this tim,e as well.  You do not need to repack the wound unless instructed otherwise.  Wear an absorbent pad or soft cotton gauze in your underwear to catch any drainage and help keep the area clean. You should change this every 2-3 hours while awake. °c. Keep the area clean and dry.  Bathe / shower every day, especially after bowel movements.  Keep the area clean by showering / bathing over the incision / wound.   It is okay to soak an open wound to help wash it.  Wet wipes or showers / gentle washing after bowel movements is often less traumatic than regular toilet paper. °d. You may have some styrofoam-like soft packing in the rectum which will come out with the first bowel movement.  °e. You will often notice bleeding with bowel movements.  This should slow down by the end of the first week of surgery °f. Expect some drainage.  This should slow down, too, by the end of the first week of surgery.  Wear an absorbent pad or soft cotton gauze in your underwear until the drainage stops. °g. Do Not sit on a rubber or pillow ring.  This can make you symptoms worse.  You may sit on a soft pillow if needed.  °6. ACTIVITIES as tolerated:   °a. You may resume regular (light) daily activities beginning the next day--such as daily self-care, walking, climbing stairs--gradually increasing activities as tolerated.  If you can walk 30 minutes without difficulty, it is safe to try more intense activity such as jogging, treadmill, bicycling, low-impact aerobics, swimming, etc. °b. Save the most intensive and strenuous activity for last such as sit-ups, heavy lifting, contact sports, etc  Refrain from any heavy lifting or straining until you are off narcotics for pain  control.   °c. You may drive when you are no longer taking prescription pain medication, you can comfortably sit for long periods of time, and you can safely maneuver your car and apply brakes. °d. You may have sexual intercourse when it is comfortable.  °7. FOLLOW UP in our office °a. Please call [facility] at [phone] to set up an appointment to see your surgeon in the office for a follow-up appointment approximately 3-4 weeks after your surgery. °b. Make sure that you call for this appointment the day you arrive home to insure a convenient appointment time. °10. IF YOU HAVE DISABILITY OR FAMILY LEAVE FORMS, BRING THEM TO THE OFFICE FOR PROCESSING.  DO NOT GIVE THEM TO YOUR DOCTOR. ° ° ° ° °WHEN TO CALL US [phone]: °1. Poor pain control °2. Reactions / problems with new medications (rash/itching, nausea, etc)  °3. Fever over 101.5 F (38.5 C) °4.   Inability to urinate 5. Nausea and/or vomiting 6. Worsening swelling or bruising 7. Continued bleeding from incision. 8. Increased pain, redness, or drainage from the incision  The clinic staff is available to answer your questions during regular business hours (8:30am-5pm).  Please dont hesitate to call and ask to speak to one of our nurses for clinical concerns.   A [facility] from [facility] is always on call at the hospitals   If you have a medical emergency, go to the nearest emergency room or call 911.    [facility] [address], [address], [place], [place]  [address] ? MAIN: [phone] ? TOLL FREE: [phone] ? FAX [phone]) [phone] [email or web address]    Post Anesthesia Home Care Instructions  Activity: Get plenty of rest for the remainder of the day. A responsible individual must stay with you for 24 hours following the procedure.  For the next 24 hours, DO NOT: -Drive a car -Advertising copywriterperate machinery -Drink alcoholic beverages -Take any medication unless instructed by your  physician -Make any legal decisions or sign important papers.  Meals: Start with liquid foods such as gelatin or soup. Progress to regular foods as tolerated. Avoid greasy, spicy, heavy foods. If nausea and/or vomiting occur, drink only clear liquids until the nausea and/or vomiting subsides. Call your physician if vomiting continues.  Special Instructions/Symptoms: Your throat may feel dry or sore from the anesthesia or the breathing tube placed in your throat during surgery. If this causes discomfort, gargle with warm salt water. The discomfort should disappear within 24 hours.  If you had a scopolamine patch placed behind your ear for the management of post- operative nausea and/or vomiting:  1. The medication in the patch is effective for 72 hours, after which it should be removed.  Wrap patch in a tissue and discard in the trash. Wash hands thoroughly with soap and water. 2. You may remove the patch earlier than 72 hours if you experience unpleasant side effects which may include dry mouth, dizziness or visual disturbances. 3. Avoid touching the patch. Wash your hands with soap and water after contact with the patch.   Information for Discharge Teaching: EXPAREL (bupivacaine liposome injectable suspension)   Your surgeon gave you EXPAREL(bupivacaine) in your surgical incision to help control your pain after surgery.   EXPAREL is a local anesthetic that provides pain relief by numbing the tissue around the surgical site.  EXPAREL is designed to release pain medication over time and can control pain for up to 72 hours.  Depending on how you respond to EXPAREL, you may require less pain medication during your recovery.  Possible side effects:  Temporary loss of sensation or ability to move in the area where bupivacaine was injected.  Nausea, vomiting, constipation  Rarely, numbness and tingling in your mouth or lips, lightheadedness, or anxiety may occur.  Call your doctor right  away if you think you may be experiencing any of these sensations, or if you have other questions regarding possible side effects.  Follow all other discharge instructions given to you by your surgeon or nurse. Eat a healthy diet and drink plenty of water or other fluids.  If you return to the hospital for any reason within 96 hours following the administration of EXPAREL, please inform your health care providers.

## 2017-08-28 NOTE — Anesthesia Procedure Notes (Signed)
Procedure Name: MAC Date/Time: 08/28/2017 9:30 AM Performed by: Wanita Chamberlain, CRNA Pre-anesthesia Checklist: Patient identified, Timeout performed, Emergency Drugs available, Suction available and Patient being monitored Patient Re-evaluated:Patient Re-evaluated prior to induction Oxygen Delivery Method: Nasal cannula Induction Type: IV induction Placement Confirmation: positive ETCO2 and breath sounds checked- equal and bilateral Dental Injury: Teeth and Oropharynx as per pre-operative assessment

## 2017-08-28 NOTE — Op Note (Addendum)
08/28/2017  9:45 AM  PATIENT:  Molly Castro  36 y.o. female  Patient Care Team: Chesley Noon, MD as PCP - General (Family Medicine)  PRE-OPERATIVE DIAGNOSIS:  ANAL POLYP  POST-OPERATIVE DIAGNOSIS:  Anal polyp, abnormal anal skin tag  PROCEDURE:   ANAL EXAM UNDER ANESTHESIA, RESECTION OF ANAL POLYP and SKIN TAG REMOVAL   Surgeon(s): Leighton Ruff, MD  ASSISTANT: none   ANESTHESIA:   local and MAC  SPECIMEN:  Source of Specimen:  internal anal polyp, external skin tag  DISPOSITION OF SPECIMEN:  PATHOLOGY  COUNTS:  YES  PLAN OF CARE: Discharge to home after PACU  PATIENT DISPOSITION:  PACU - hemodynamically stable.  INDICATION: 36 y.o. F who presented to the office after colonoscopy revealed a perianal skin tag.  She elected to have this removed.  On further discussion with her preoperatively she also has a nodule in the external portion of her anal canal that has been giving her some discomfort.  She requests that we remove this as well if it did not involve a lengthy recovery.   OR FINDINGS: Right lateral anal canal polyp.  Abnormal appearing right lateral perianal skin tag with associated acute anal fissure.  DESCRIPTION: the patient was identified in the preoperative holding area and taken to the OR where they were laid on the operating room table.  MAC anesthesia was induced without difficulty. The patient was then positioned in prone jackknife position with buttocks gently taped apart.  The patient was then prepped and draped in usual sterile fashion.  SCDs were noted to be in place prior to the initiation of anesthesia. A surgical timeout was performed indicating the correct patient, procedure, positioning and need for preoperative antibiotics.  A rectal block was performed using Marcaine with epinephrine mixed with Exparel.    I began with a digital rectal exam.  The polyp could be palpated in the right lateral anal canal.  I then placed a Hill-Ferguson anoscope  into the anal canal and evaluated this completely.  The polyp was visualized.  There was no other hemorrhoid.  There was an acute anal fissure in the right lateral anal canal.  Distal to this there were 2 small skin tags that appear to be chronically inflamed.  I decided to remove these external skin tags using Metzenbaum scissors.  I then remove the polyp as well with the scissors.  The mucosal edges of the polyp resection were closed using a running 2-0 chromic suture and the skin tag anoderm was reapproximated using a running 3-0 chromic suture.  Hemostasis was good.  Additional Marcaine and Exparel were applied to the tissue surrounding the incision.  Lidocaine ointment and a sterile dressing were then applied.  The patient was awakened from anesthesia and sent to postanesthesia care unit in stable condition.  All counts were correct per operating room staff.   I have reviewed the [place] and see no other active prescriptions for this patient.

## 2017-08-28 NOTE — Transfer of Care (Signed)
Immediate Anesthesia Transfer of Care Note  Patient: Molly Castro  Procedure(s) Performed: ANAL EXAM UNDER ANESTHESIA, RESECTION OF ANAL POLYP , EXCISION SKIN TAG (N/A )  Patient Location: PACU  Anesthesia Type:MAC  Level of Consciousness: awake, alert , oriented and patient cooperative  Airway & Oxygen Therapy: Patient Spontanous Breathing and Patient connected to nasal cannula oxygen  Post-op Assessment: Report given to RN and Post -op Vital signs reviewed and stable  Post vital signs: Reviewed and stable  Last Vitals:  Vitals:   08/28/17 0844  BP: 114/68  Pulse: 71  Resp: 16  Temp: 36.7 C  SpO2: 100%    Last Pain:  Vitals:   08/28/17 0844  TempSrc: Oral      Patients Stated Pain Goal: 5 (64/68/03 2122)  Complications: No apparent anesthesia complications

## 2017-08-28 NOTE — H&P (Signed)
The patient is a 36 year old female who presents with a complaint of anal problems. 36 year old female who presents to the office after treatment of an anal fissure with diltiazem. Once this was healed, she was taken for colonoscopy by Dr. Rhea BeltonPyrtle. An anal skin tag was biopsied and showed signs of hyperplasia but there was concern for some condylomatous changes. She is here today for evaluation. She states that she is having better bowel function but continues to have some pain with bowel movements. She denies any more bleeding. She is trying to find a bowel regimen that works for her consistently.   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 Liothyronine Sodium (5MCG Tablet, Oral) Active. NITROGLYCERIN CREAM (2% Cream, see note Cream External four times daily, Taken starting 02/03/2017) Active. (Apply to rectum) Levothyroxine Sodium (50MCG Tablet, Oral) Active. Nortrel 0.5/35 (28) (0.5-35MG -MCG Tablet, Oral) Active. Medications Reconciled Hydrocortisone Acetate (25MG  Suppository, 1 (one) Suppository Rectal two times daily, as needed, Taken starting 06/13/2017) Active.  Social History Romie Levee(Girlie Veltri, MD; 06/13/2017 12:26 PM) Alcohol use Moderate alcohol use. No caffeine use No drug use Tobacco use Never smoker.  Family History Romie Levee(Bryony Kaman, MD; 06/13/2017 12:26 PM) Alcohol Abuse Mother. Bleeding disorder Mother. Colon Cancer Mother.  Pregnancy / Birth  History Romie Levee(Cylee Dattilo, MD; 06/13/2017 12:26 PM) Age at menarche 17 years. Contraceptive History Oral contraceptives. Gravida 2 Irregular periods Length (months) of breastfeeding 12-24 Maternal age 35-30 Para 2  Other Problems Romie Levee(Dinisha Cai, MD; 06/13/2017 12:26 PM) Back Pain Gastroesophageal Reflux Disease Hemorrhoids Thyroid Disease     Review of Systems General Present- Fatigue. Not Present- Appetite Loss, Chills, Fever, Night Sweats, Weight Gain and Weight Loss. Skin Present- New Lesions. Not Present- Change in Wart/Mole, Dryness, Hives, Jaundice, Non-Healing Wounds, Rash and Ulcer. HEENT Present- Seasonal Allergies. Not Present- Earache, Hearing Loss, Hoarseness, Nose Bleed, Oral Ulcers, Ringing in the Ears, Sinus Pain, Sore Throat, Visual Disturbances, Wears glasses/contact lenses and Yellow Eyes. Gastrointestinal Present- Abdominal Pain, Bloating, Gets full quickly at meals, Hemorrhoids and Rectal Pain. Not Present- Bloody Stool, Change in Bowel Habits, Chronic diarrhea, Constipation, Difficulty Swallowing, Excessive gas, Indigestion, Nausea and Vomiting. Female Genitourinary Present- Pelvic Pain. Not Present- Frequency, Nocturia, Painful Urination and Urgency. Musculoskeletal Present- Back Pain. Not Present- Joint Pain, Joint Stiffness, Muscle Pain, Muscle Weakness and Swelling of Extremities. Endocrine Present- Cold Intolerance and Hair Changes. Not Present- Excessive Hunger, Heat Intolerance, Hot flashes and New Diabetes.  BP 114/68   Pulse 71   Temp 98.1 F (36.7 C) (Oral)   Resp 16   Ht 5\' 5"  (1.651 m)   Wt 56.5 kg (124 lb 9.6 oz)   LMP 08/27/2010   SpO2 100%   BMI 20.73 kg/m     Physical Exam   General Mental Status-Alert. General Appearance-Not in acute distress. Build & Nutrition-Well nourished. Posture-Normal posture. Gait-Normal.  Head and Neck Head-normocephalic, atraumatic with no lesions or palpable  masses. Trachea-midline.  Chest and Lung Exam Chest and lung exam reveals -on auscultation, normal breath sounds, no adventitious sounds and normal vocal resonance.  Cardiovascular Cardiovascular examination reveals -normal  heart sounds, regular rate and rhythm with no murmurs and no digital clubbing, cyanosis, edema, increased warmth or tenderness.  Abdomen Inspection Inspection of the abdomen reveals - No Hernias. Palpation/Percussion Palpation and Percussion of the abdomen reveal - Soft, Non Tender, No Rigidity (guarding), No hepatosplenomegaly and No Palpable abdominal masses.  Rectal Anorectal Exam External - skin tag. Internal - normal sphincter tone and generalized tenderness.  Neurologic Neurologic evaluation reveals -alert and oriented x 3 with no impairment of recent or remote memory, normal attention span and ability to concentrate, normal sensation and normal coordination.  Musculoskeletal Normal Exam - Bilateral-Upper Extremity Strength Normal and Lower Extremity Strength Normal.   Results Romie Levee(Kalmen Lollar MD; 06/13/2017 11:52 AM)  ANOSCOPY, DIAGNOSTIC ([id number]) [ Hemorrhoids ]  Procedure: Anoscopy Surgeon: Maisie Fushomas After the risks and benefits were explained, verbal consent was obtained for above procedure. A medical assistant chaperone was present thoroughout the entire procedure. Anesthesia: none Diagnosis: anal polyp Findings: Large polyp proximal to the dentate line anteriorly. Grade 1 internal hemorrhoids with significant anal canal inflammation.  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 Biopsies show possible condylomatous changes. I have evaluated her and the polyp appears to be at the anal verge in the anterior rectum. I think that this could be easily resected in the operating room and the  patient does wish for this to be resected due to the abnormal findings on biopsy. We discussed that there is a high likelihood that this will be completely benign. Risk include bleeding and pain. She does have quite a bit of inflammation in the anal canal and I have recommended that she use a steroid suppository when necessary. I see no signs of a recurrent fissure.

## 2017-08-28 NOTE — Anesthesia Postprocedure Evaluation (Signed)
Anesthesia Post Note  Patient: Molly Castro  Procedure(s) Performed: ANAL EXAM UNDER ANESTHESIA, RESECTION OF ANAL POLYP , EXCISION SKIN TAG (N/A )     Patient location during evaluation: PACU Anesthesia Type: MAC Level of consciousness: awake and alert Pain management: pain level controlled Vital Signs Assessment: post-procedure vital signs reviewed and stable Respiratory status: spontaneous breathing, nonlabored ventilation, respiratory function stable and patient connected to nasal cannula oxygen Cardiovascular status: stable and blood pressure returned to baseline Postop Assessment: no apparent nausea or vomiting Anesthetic complications: no    Last Vitals:  Vitals:   08/28/17 1045 08/28/17 1151  BP:  122/67  Pulse: 84 79  Resp: 17 18  Temp:  36.7 C  SpO2: 100% 98%    Last Pain:  Vitals:   08/28/17 1151  TempSrc: Oral                 Malkie Wille S

## 2017-08-28 NOTE — Anesthesia Preprocedure Evaluation (Signed)
Anesthesia Evaluation  Patient identified by MRN, date of birth, ID band Patient awake    Reviewed: Allergy & Precautions, H&P , NPO status , Patient's Chart, lab work & pertinent test results  Airway Mallampati: II   Neck ROM: full    Dental   Pulmonary neg pulmonary ROS,    breath sounds clear to auscultation       Cardiovascular negative cardio ROS   Rhythm:regular Rate:Normal     Neuro/Psych    GI/Hepatic GERD  ,  Endo/Other    Renal/GU      Musculoskeletal   Abdominal   Peds  Hematology   Anesthesia Other Findings   Reproductive/Obstetrics                             Anesthesia Physical Anesthesia Plan  ASA: II  Anesthesia Plan: MAC   Post-op Pain Management:    Induction: Intravenous  PONV Risk Score and Plan: 2 and Ondansetron, Dexamethasone, Midazolam and Treatment may vary due to age or medical condition  Airway Management Planned: Simple Face Mask  Additional Equipment:   Intra-op Plan:   Post-operative Plan:   Informed Consent: I have reviewed the patients History and Physical, chart, labs and discussed the procedure including the risks, benefits and alternatives for the proposed anesthesia with the patient or authorized representative who has indicated his/her understanding and acceptance.     Plan Discussed with: CRNA, Anesthesiologist and Surgeon  Anesthesia Plan Comments:         Anesthesia Quick Evaluation

## 2017-09-26 DIAGNOSIS — Z6820 Body mass index (BMI) 20.0-20.9, adult: Secondary | ICD-10-CM | POA: Diagnosis not present

## 2017-09-26 DIAGNOSIS — Z01419 Encounter for gynecological examination (general) (routine) without abnormal findings: Secondary | ICD-10-CM | POA: Diagnosis not present

## 2017-10-02 DIAGNOSIS — K080 Exfoliation of teeth due to systemic causes: Secondary | ICD-10-CM | POA: Diagnosis not present

## 2018-01-16 DIAGNOSIS — E039 Hypothyroidism, unspecified: Secondary | ICD-10-CM | POA: Diagnosis not present

## 2018-03-13 DIAGNOSIS — E039 Hypothyroidism, unspecified: Secondary | ICD-10-CM | POA: Diagnosis not present

## 2018-03-13 DIAGNOSIS — Z0000 Encounter for general adult medical examination without abnormal findings: Secondary | ICD-10-CM | POA: Diagnosis not present

## 2018-03-13 DIAGNOSIS — Z6820 Body mass index (BMI) 20.0-20.9, adult: Secondary | ICD-10-CM | POA: Diagnosis not present

## 2018-03-13 DIAGNOSIS — B009 Herpesviral infection, unspecified: Secondary | ICD-10-CM | POA: Diagnosis not present

## 2018-07-01 ENCOUNTER — Telehealth: Payer: Self-pay | Admitting: Internal Medicine

## 2018-07-01 NOTE — Telephone Encounter (Signed)
Would only be able to tell by examination/looking at the area in question I am hospital MD this week. Could try to see if Molly Castro could see her to take a look if she wants

## 2018-07-01 NOTE — Telephone Encounter (Signed)
Pt aware but needs to be seen Friday as she is off that day. Pt scheduled to see Doug Sou PA 07/03/18@9am . Pt aware of appt.

## 2018-07-01 NOTE — Telephone Encounter (Signed)
Patient states after she had a colon last year with Dr.Pyrtle, he sent her to [facility] to have a anal polyp removed. Patient states since then she has a spot on her rectum that has been bothersome and wants some advice. Pt states she has tried cream and that does not work.

## 2018-07-01 NOTE — Telephone Encounter (Signed)
Pt had an anal polyp removed last year by Dr. Maisie Fus and she also did a fissure repair. Pt states she has a place on her rectum that is bothersome since the surgery. Reports she saw her PCP and she said it did not look like a hemorrhoid and did not look like a skin tag. Suggested she try some of her creams (diltiazem, hydrocortisone) but they did not help. Sitz baths didn't help either. States she has had no bleeding but it itches some. Pt states that when she is wiping it feels better if she pitches the area. Wondered if possibly a stitch? Please advise.

## 2018-07-03 ENCOUNTER — Ambulatory Visit: Payer: Federal, State, Local not specified - PPO | Admitting: Gastroenterology

## 2018-07-03 ENCOUNTER — Encounter: Payer: Self-pay | Admitting: Gastroenterology

## 2018-07-03 VITALS — BP 100/62 | HR 69 | Ht 65.0 in | Wt 121.0 lb

## 2018-07-03 DIAGNOSIS — K629 Disease of anus and rectum, unspecified: Secondary | ICD-10-CM | POA: Diagnosis not present

## 2018-07-03 NOTE — Patient Instructions (Addendum)
   Please follow up with Dr Maisie Fus.     I appreciate the opportunity to care for you. Doug Sou, PA-C

## 2018-07-03 NOTE — Progress Notes (Addendum)
07/03/2018 [name] [id number] 13-Jan-1981   HISTORY OF PRESENT ILLNESS:  This is a 37 year old female who is a patient of Dr. Lauro Castro.  Had a colonoscopy in 04/2017.  Had internal hemorrhoids and a polypoid anal lesion.  She had squamous mucosa with hyperplastic changes on pathology.  Saw Dr. 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  She is here again today with complaints of a perianal lesion.  She says that at her follow-up post-surgery with Dr. Maisie Castro she was told that it may be scar tissue/the way it healed.  Has been very bothersome over the past year.  Is very hard in consistency and says that it feels very itchy.  She is done sitz bath and hydrocortisone creams with no relief.    Past Medical History:  Diagnosis Date  . Anal polyp   . Family history of colon cancer   . GERD (gastroesophageal reflux disease)    with pregnancy  . History of anal fissures   . 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 CESAREAN SECTION  12/06/2011   Procedure: CESAREAN SECTION;  Surgeon: Molly Castro;  Location: [facility];  Service: Gynecology;  Laterality: N/A;  . CESAREAN SECTION N/A 10/30/2013   Procedure: Repeat CESAREAN SECTION;  Surgeon: Molly Castro;  Location: [facility];  Service: Obstetrics;  Laterality: N/A;  EDD: 11/04/13  . COLONOSCOPY  05-05-2017   dr pyrtle  . WISDOM TOOTH EXTRACTION  2004    reports that she has never smoked. She has never used smokeless tobacco. She reports that she drinks about 3.0 standard drinks of alcohol per week. She reports that she does not use drugs. family history includes Colon cancer in her maternal grandmother; Colon cancer (age of onset: 22) in her mother; Pancreatic cancer in her father and paternal uncle. No Known Allergies    Outpatient Encounter Medications as of 07/03/2018  Medication Sig  . levothyroxine (SYNTHROID,  LEVOTHROID) 75 MCG tablet Take 50 mcg by mouth daily before breakfast.   . liothyronine (CYTOMEL) 5 MCG tablet Take 5 mcg by mouth daily.  . Nutritional Supplements (JUICE PLUS FIBRE PO) Take 4 capsules by mouth daily.  . [DISCONTINUED] HYDROcodone-acetaminophen (NORCO/VICODIN) 5-325 MG tablet Take 1 tablet by mouth every 4 (four) hours as needed.  . [DISCONTINUED] norethindrone-ethinyl estradiol (NECON,BREVICON,MODICON) 0.5-35 MG-MCG tablet Take 1 tablet by mouth every evening.   . [DISCONTINUED] Probiotic Product (PROBIOTIC DAILY) CAPS Take by mouth.   No facility-administered encounter medications on file as of 07/03/2018.      REVIEW OF SYSTEMS  : All other systems reviewed and negative except where noted in the History of Present Illness.   PHYSICAL EXAM: BP 100/62   Pulse 69   Ht 5\' 5"  (1.651 m)   Wt 121 lb (54.9 kg)   BMI 20.14 kg/m  General: Well developed white female in no acute distress Head: Normocephalic and atraumatic Eyes:  Sclerae anicteric, conjunctiva pink. Ears: Normal auditory acuity Rectal:  Right side perianal lesion noted.  It was fleshy in color and hard in consistency. Musculoskeletal: Symmetrical with no gross deformities  Skin: No lesions on visible extremities Extremities: No edema  Neurological: Alert oriented x 4, grossly non-focal Psychological:  Alert and cooperative. Normal mood and affect  ASSESSMENT AND PLAN: *Anal lesion:  Previously had an anal polyp removed by Dr. Maisie Castro.  ?  If this is a large skin tag vs scar tissue (? If she had a retained suture that caused some hypertrophy in that area) vs condyloma.  Should return to see Dr. Maisie Castro.   CC:  Molly Castro  Addendum: Reviewed and agree with assessment and management plan for her to be reevaluated by Dr. Maisie Castro. Molly Castro

## 2019-04-20 ENCOUNTER — Institutional Professional Consult (permissible substitution): Payer: Federal, State, Local not specified - PPO | Admitting: Plastic Surgery

## 2019-05-07 ENCOUNTER — Other Ambulatory Visit: Payer: Self-pay

## 2019-05-07 ENCOUNTER — Ambulatory Visit (INDEPENDENT_AMBULATORY_CARE_PROVIDER_SITE_OTHER): Payer: Self-pay | Admitting: Plastic Surgery

## 2019-05-07 ENCOUNTER — Encounter: Payer: Self-pay | Admitting: Plastic Surgery

## 2019-05-07 DIAGNOSIS — Z719 Counseling, unspecified: Secondary | ICD-10-CM | POA: Insufficient documentation

## 2019-05-07 NOTE — Progress Notes (Signed)
     Patient ID: Molly Castro, female    DOB: [birth year], 38 y.o.   MRN: [id number]   Chief Complaint  Patient presents with  . Advice Only    The patient is a 38 yrs old wf here for consultation for facial changes.  She feels that her chin is too prominent.  She also does not like the small amount of excess fat under the submental area.  She was seen by an Chief Financial Officer and is scheduled for a set back.  She is still thinking about her options.     Review of Systems  Constitutional: Negative.   HENT: Negative.   Eyes: Negative.   Respiratory: Negative.   Cardiovascular: Negative.   Gastrointestinal: Negative.   Genitourinary: Negative.   Musculoskeletal: Negative.   Hematological: Negative.   Psychiatric/Behavioral: Negative.     Past Medical History:  Diagnosis Date  . Anal polyp   . Family history of colon cancer   . GERD (gastroesophageal reflux disease)    with pregnancy  . History of anal fissures   . 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 CESAREAN SECTION  12/06/2011   Procedure: CESAREAN SECTION;  Surgeon: Claiborne Billings A. Pamala Hurry, MD;  Location: [facility];  Service: Gynecology;  Laterality: N/A;  . CESAREAN SECTION N/A 10/30/2013   Procedure: Repeat CESAREAN SECTION;  Surgeon: Lovenia Kim, MD;  Location: [facility];  Service: Obstetrics;  Laterality: N/A;  EDD: 11/04/13  . COLONOSCOPY  05-05-2017   dr pyrtle  . WISDOM TOOTH EXTRACTION  2004      Current Outpatient Medications:  .  levothyroxine (SYNTHROID, LEVOTHROID) 75 MCG tablet, Take 50 mcg by mouth daily before breakfast. , Disp: , Rfl:  .  liothyronine (CYTOMEL) 5 MCG tablet, Take 5 mcg by mouth daily., Disp: , Rfl:  .  Nutritional Supplements (JUICE PLUS FIBRE PO), Take 4 capsules by mouth daily., Disp: , Rfl:    Objective:   Vitals:   05/07/19 1335  BP: 107/69  Pulse: 71  Temp: 97.8 F (36.6 C)  SpO2: 100%    Physical Exam  Constitutional:      Appearance: Normal appearance.  HENT:     Head: Normocephalic and atraumatic.  Cardiovascular:     Rate and Rhythm: Normal rate.     Pulses: Normal pulses.  Neurological:     General: No focal deficit present.     Mental Status: She is alert and oriented to person, place, and time.  Psychiatric:        Mood and Affect: Mood normal.        Behavior: Behavior normal.     Assessment & Plan:  Encounter for counseling  Is a candidate for Kybella.  Patient given a quote and is going to think about it.  We had a long discussion about options for minor changes.  Ultimately the oral surgery would likely give her the best long term result. Pictures were obtained of the patient and placed in the chart with the patient's or guardian's permission.   [place], DO

## 2019-06-04 ENCOUNTER — Encounter: Payer: Self-pay | Admitting: Internal Medicine

## 2019-06-04 NOTE — Telephone Encounter (Signed)
Please let patient know that I did receive a note from Dr. Quentin Cornwall.  I reviewed his findings.  I also spoke to [place] in person recently. I feel that the planned exam under anesthesia and possible fistulotomy is the right course of action. Given her comfortability with Dr. Quentin Cornwall I would recommend she proceed as scheduled on October 7. If she would like to see me before then I can make room in my schedule otherwise I am happy for her to follow-up afterwards if she would like Please let me know if she has additional questions

## 2019-06-11 ENCOUNTER — Ambulatory Visit: Payer: Federal, State, Local not specified - PPO | Admitting: Plastic Surgery

## 2019-06-30 ENCOUNTER — Other Ambulatory Visit: Payer: Self-pay

## 2019-06-30 DIAGNOSIS — Z20822 Contact with and (suspected) exposure to covid-19: Secondary | ICD-10-CM

## 2019-07-01 LAB — NOVEL CORONAVIRUS, NAA: SARS-CoV-2, NAA: NOT DETECTED

## 2019-08-04 ENCOUNTER — Other Ambulatory Visit: Payer: Self-pay | Admitting: Oral and Maxillofacial Surgery

## 2019-08-20 IMAGING — MG 2D DIGITAL SCREENING BILATERAL MAMMOGRAM WITH CAD AND ADJUNCT TO
8 of 12 series · 8 of 28 positions shown · non-contrast
Comparison: None.

CLINICAL DATA: Screening.

EXAM:
2D DIGITAL SCREENING BILATERAL MAMMOGRAM WITH CAD AND ADJUNCT TOMO

[L MLO synth-2D]
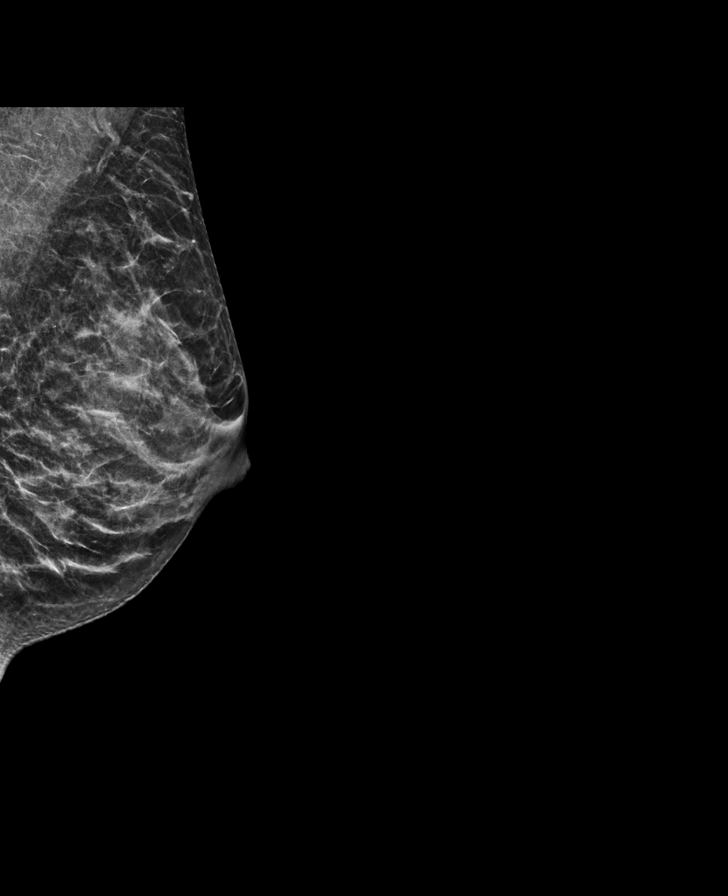

[R MLO]
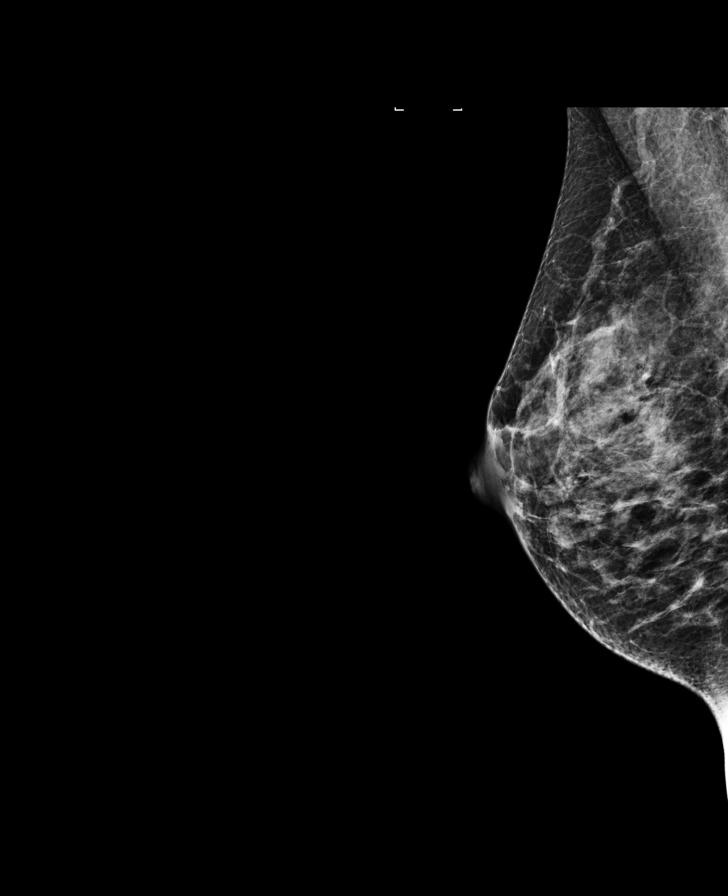

[L CC synth-2D]
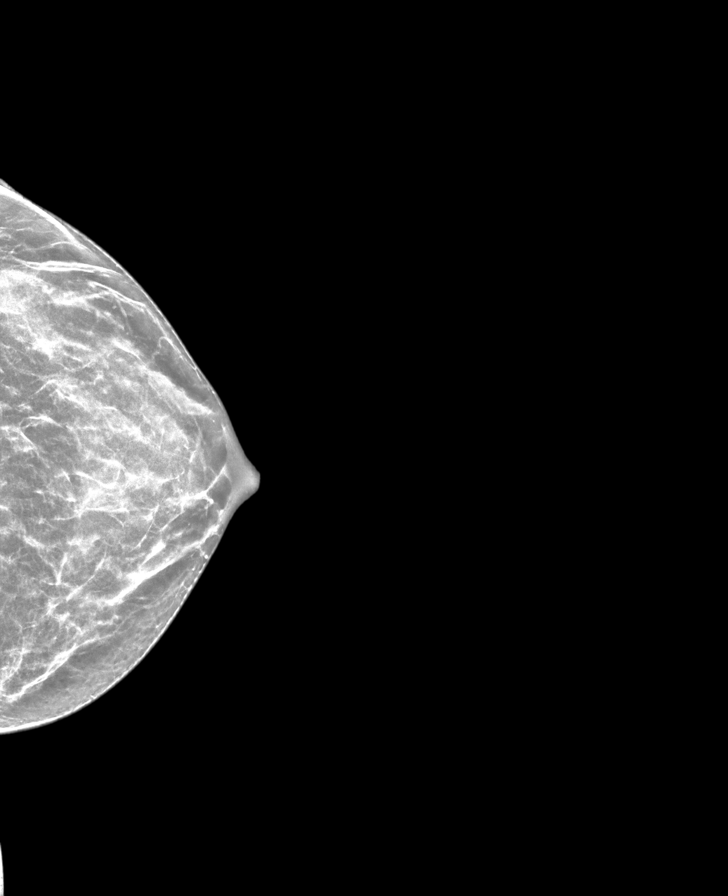

[R MLO synth-2D]
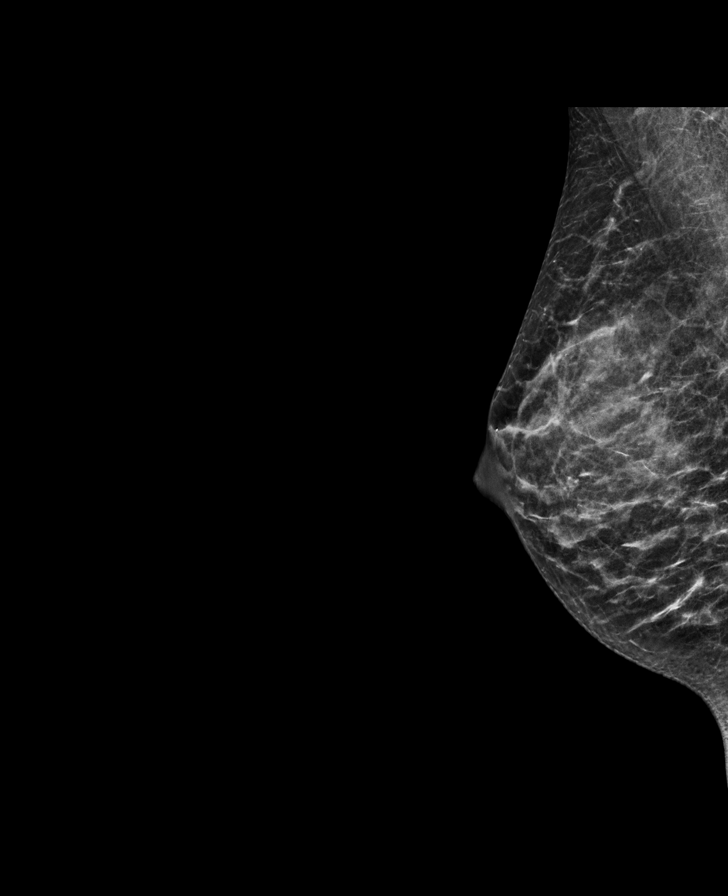

[R CC]
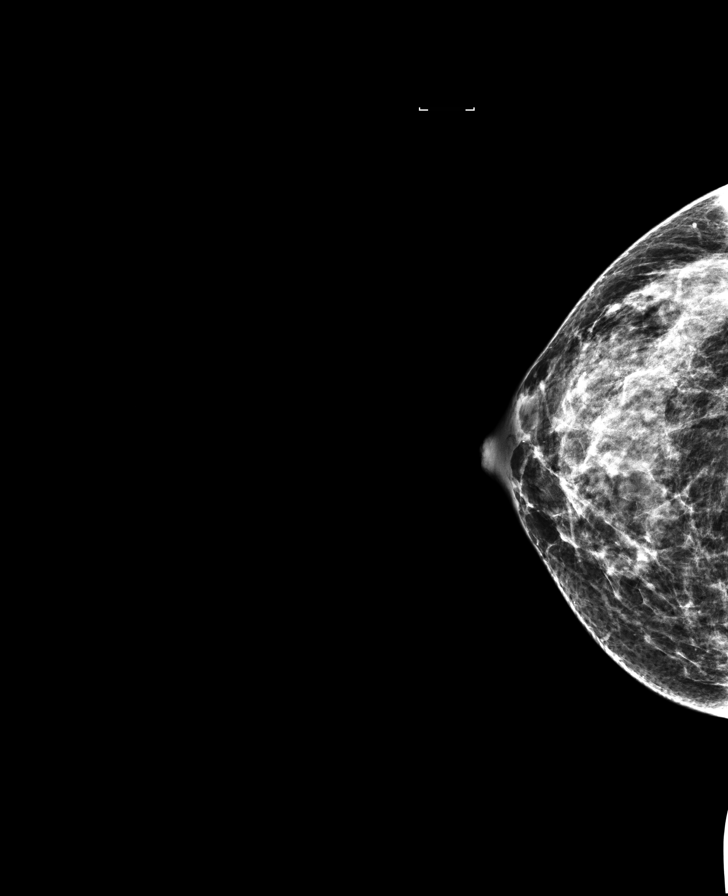

[L CC]
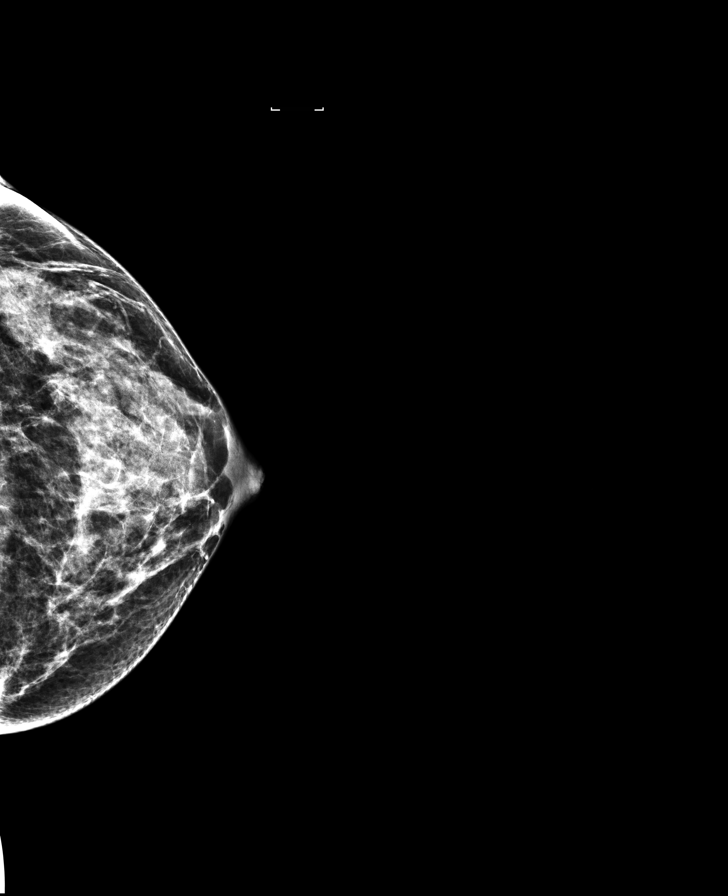

[L MLO]
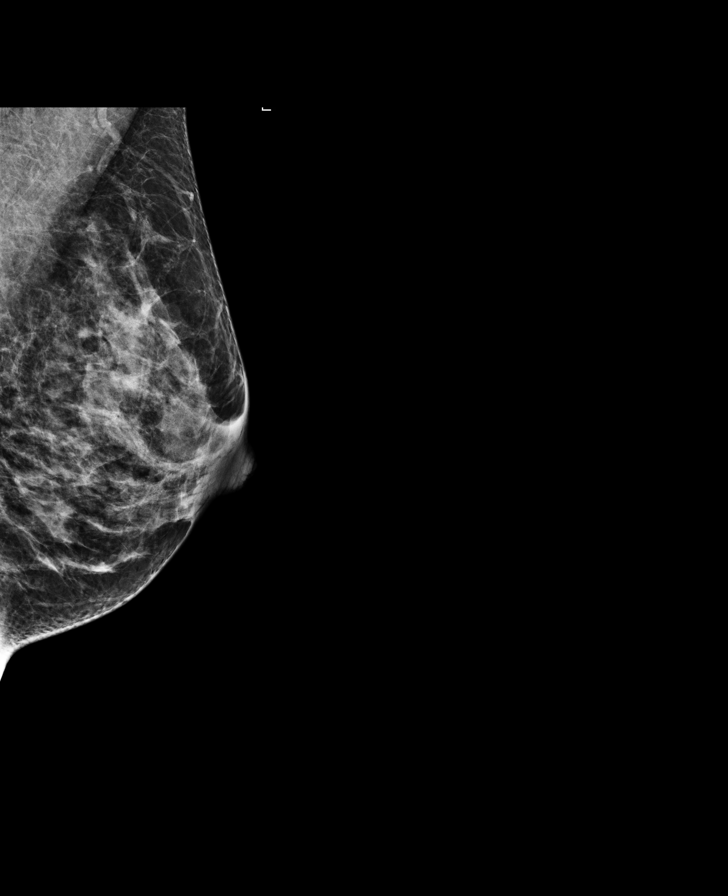

[R CC synth-2D]
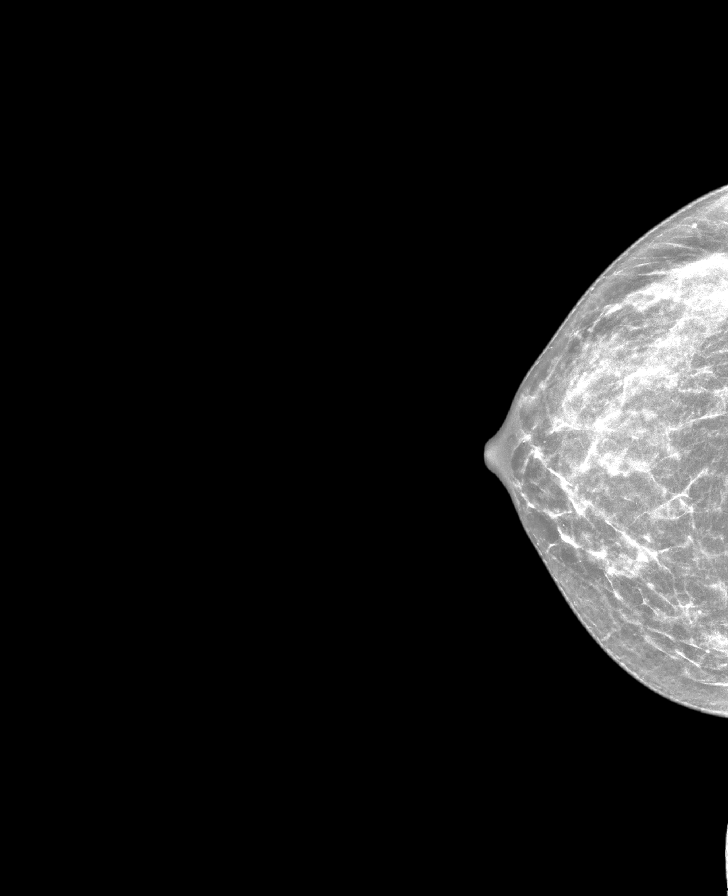

[8 of 28 positions shown; findings below may reference images not displayed]

ACR Breast Density Category c: The breast tissue is heterogeneously
dense, which may obscure small masses
FINDINGS: There are no findings suspicious for malignancy. Images were
processed with CAD.
IMPRESSION: No mammographic evidence of malignancy. A result letter of this
screening mammogram will be mailed directly to the patient.

RECOMMENDATION:
Screening mammogram at age 41. (Code:[id number])

BI-RADS CATEGORY  1: Negative.

## 2019-10-24 ENCOUNTER — Ambulatory Visit: Payer: Federal, State, Local not specified - PPO | Attending: Internal Medicine

## 2019-10-24 DIAGNOSIS — Z23 Encounter for immunization: Secondary | ICD-10-CM | POA: Insufficient documentation

## 2019-10-24 NOTE — Progress Notes (Signed)
   Covid-19 Vaccination Clinic  Name:  ALAYJAH BOEHRINGER    MRN: [id number] DOB: 1981-01-16  10/24/2019  Ms. Reber was observed post Covid-19 immunization for 15 minutes without incidence. She was provided with Vaccine Information Sheet and instruction to access the V-Safe system.   Ms. Kincannon was instructed to call 911 with any severe reactions post vaccine: Marland Kitchen Difficulty breathing  . Swelling of your face and throat  . A fast heartbeat  . A bad rash all over your body  . Dizziness and weakness    Immunizations Administered    Name Date Dose VIS Date Route   Pfizer COVID-19 Vaccine 10/24/2019 10:36 AM 0.3 mL 08/27/2019 Intramuscular   Manufacturer: [employer], [employer]   Lot: [id number]   NDC: [address]-2

## 2019-11-17 ENCOUNTER — Ambulatory Visit: Payer: Federal, State, Local not specified - PPO

## 2019-11-18 ENCOUNTER — Ambulatory Visit: Payer: Federal, State, Local not specified - PPO | Attending: Internal Medicine

## 2019-11-18 DIAGNOSIS — Z23 Encounter for immunization: Secondary | ICD-10-CM | POA: Insufficient documentation

## 2019-11-18 NOTE — Progress Notes (Signed)
   Covid-19 Vaccination Clinic  Name:  Molly Castro    MRN: [id number] DOB: 12-05-1980  11/18/2019  [name] was observed post Covid-19 immunization for 15 minutes without incident. She was provided with Vaccine Information Sheet and instruction to access the V-Safe system.   [name] was instructed to call 911 with any severe reactions post vaccine: Marland Kitchen Difficulty breathing  . Swelling of face and throat  . A fast heartbeat  . A bad rash all over body  . Dizziness and weakness   Immunizations Administered    Name Date Dose VIS Date Route   Pfizer COVID-19 Vaccine 11/18/2019  5:22 PM 0.3 mL 08/27/2019 Intramuscular   Manufacturer: [employer], [employer]   Lot: [id number]   NDC: [address]-4

## 2020-05-04 DIAGNOSIS — Z20822 Contact with and (suspected) exposure to covid-19: Secondary | ICD-10-CM | POA: Diagnosis not present

## 2020-06-30 ENCOUNTER — Other Ambulatory Visit: Payer: Self-pay | Admitting: Physician Assistant

## 2020-06-30 DIAGNOSIS — Z1231 Encounter for screening mammogram for malignant neoplasm of breast: Secondary | ICD-10-CM

## 2020-07-07 DIAGNOSIS — E039 Hypothyroidism, unspecified: Secondary | ICD-10-CM | POA: Diagnosis not present

## 2020-07-14 DIAGNOSIS — E039 Hypothyroidism, unspecified: Secondary | ICD-10-CM | POA: Diagnosis not present

## 2020-07-14 DIAGNOSIS — Z1231 Encounter for screening mammogram for malignant neoplasm of breast: Secondary | ICD-10-CM | POA: Diagnosis not present

## 2020-08-04 ENCOUNTER — Ambulatory Visit: Payer: Federal, State, Local not specified - PPO

## 2020-08-18 DIAGNOSIS — M9903 Segmental and somatic dysfunction of lumbar region: Secondary | ICD-10-CM | POA: Diagnosis not present

## 2020-08-18 DIAGNOSIS — M542 Cervicalgia: Secondary | ICD-10-CM | POA: Diagnosis not present

## 2020-08-18 DIAGNOSIS — M9905 Segmental and somatic dysfunction of pelvic region: Secondary | ICD-10-CM | POA: Diagnosis not present

## 2020-08-18 DIAGNOSIS — M9901 Segmental and somatic dysfunction of cervical region: Secondary | ICD-10-CM | POA: Diagnosis not present

## 2020-08-18 DIAGNOSIS — M9902 Segmental and somatic dysfunction of thoracic region: Secondary | ICD-10-CM | POA: Diagnosis not present

## 2020-08-18 DIAGNOSIS — M40292 Other kyphosis, cervical region: Secondary | ICD-10-CM | POA: Diagnosis not present

## 2020-08-21 DIAGNOSIS — M9902 Segmental and somatic dysfunction of thoracic region: Secondary | ICD-10-CM | POA: Diagnosis not present

## 2020-08-21 DIAGNOSIS — M9905 Segmental and somatic dysfunction of pelvic region: Secondary | ICD-10-CM | POA: Diagnosis not present

## 2020-08-21 DIAGNOSIS — M9903 Segmental and somatic dysfunction of lumbar region: Secondary | ICD-10-CM | POA: Diagnosis not present

## 2020-08-21 DIAGNOSIS — M9901 Segmental and somatic dysfunction of cervical region: Secondary | ICD-10-CM | POA: Diagnosis not present

## 2020-08-23 DIAGNOSIS — M9905 Segmental and somatic dysfunction of pelvic region: Secondary | ICD-10-CM | POA: Diagnosis not present

## 2020-08-23 DIAGNOSIS — M9901 Segmental and somatic dysfunction of cervical region: Secondary | ICD-10-CM | POA: Diagnosis not present

## 2020-08-23 DIAGNOSIS — M9903 Segmental and somatic dysfunction of lumbar region: Secondary | ICD-10-CM | POA: Diagnosis not present

## 2020-08-23 DIAGNOSIS — M9902 Segmental and somatic dysfunction of thoracic region: Secondary | ICD-10-CM | POA: Diagnosis not present

## 2020-08-25 DIAGNOSIS — M9902 Segmental and somatic dysfunction of thoracic region: Secondary | ICD-10-CM | POA: Diagnosis not present

## 2020-08-25 DIAGNOSIS — M9901 Segmental and somatic dysfunction of cervical region: Secondary | ICD-10-CM | POA: Diagnosis not present

## 2020-08-25 DIAGNOSIS — M9905 Segmental and somatic dysfunction of pelvic region: Secondary | ICD-10-CM | POA: Diagnosis not present

## 2020-08-25 DIAGNOSIS — M9903 Segmental and somatic dysfunction of lumbar region: Secondary | ICD-10-CM | POA: Diagnosis not present

## 2020-08-28 DIAGNOSIS — M9901 Segmental and somatic dysfunction of cervical region: Secondary | ICD-10-CM | POA: Diagnosis not present

## 2020-08-28 DIAGNOSIS — M9903 Segmental and somatic dysfunction of lumbar region: Secondary | ICD-10-CM | POA: Diagnosis not present

## 2020-08-28 DIAGNOSIS — M9905 Segmental and somatic dysfunction of pelvic region: Secondary | ICD-10-CM | POA: Diagnosis not present

## 2020-08-28 DIAGNOSIS — M9902 Segmental and somatic dysfunction of thoracic region: Secondary | ICD-10-CM | POA: Diagnosis not present

## 2020-08-31 DIAGNOSIS — M9901 Segmental and somatic dysfunction of cervical region: Secondary | ICD-10-CM | POA: Diagnosis not present

## 2020-08-31 DIAGNOSIS — M9902 Segmental and somatic dysfunction of thoracic region: Secondary | ICD-10-CM | POA: Diagnosis not present

## 2020-08-31 DIAGNOSIS — M9903 Segmental and somatic dysfunction of lumbar region: Secondary | ICD-10-CM | POA: Diagnosis not present

## 2020-08-31 DIAGNOSIS — M9905 Segmental and somatic dysfunction of pelvic region: Secondary | ICD-10-CM | POA: Diagnosis not present

## 2020-09-01 DIAGNOSIS — M9902 Segmental and somatic dysfunction of thoracic region: Secondary | ICD-10-CM | POA: Diagnosis not present

## 2020-09-01 DIAGNOSIS — M9905 Segmental and somatic dysfunction of pelvic region: Secondary | ICD-10-CM | POA: Diagnosis not present

## 2020-09-01 DIAGNOSIS — M9901 Segmental and somatic dysfunction of cervical region: Secondary | ICD-10-CM | POA: Diagnosis not present

## 2020-09-01 DIAGNOSIS — M9903 Segmental and somatic dysfunction of lumbar region: Secondary | ICD-10-CM | POA: Diagnosis not present

## 2020-09-04 DIAGNOSIS — M9902 Segmental and somatic dysfunction of thoracic region: Secondary | ICD-10-CM | POA: Diagnosis not present

## 2020-09-04 DIAGNOSIS — M9901 Segmental and somatic dysfunction of cervical region: Secondary | ICD-10-CM | POA: Diagnosis not present

## 2020-09-04 DIAGNOSIS — M9903 Segmental and somatic dysfunction of lumbar region: Secondary | ICD-10-CM | POA: Diagnosis not present

## 2020-09-04 DIAGNOSIS — M9905 Segmental and somatic dysfunction of pelvic region: Secondary | ICD-10-CM | POA: Diagnosis not present

## 2020-09-06 DIAGNOSIS — M9902 Segmental and somatic dysfunction of thoracic region: Secondary | ICD-10-CM | POA: Diagnosis not present

## 2020-09-06 DIAGNOSIS — M9901 Segmental and somatic dysfunction of cervical region: Secondary | ICD-10-CM | POA: Diagnosis not present

## 2020-09-06 DIAGNOSIS — M9903 Segmental and somatic dysfunction of lumbar region: Secondary | ICD-10-CM | POA: Diagnosis not present

## 2020-09-06 DIAGNOSIS — M9905 Segmental and somatic dysfunction of pelvic region: Secondary | ICD-10-CM | POA: Diagnosis not present

## 2020-09-11 DIAGNOSIS — M9901 Segmental and somatic dysfunction of cervical region: Secondary | ICD-10-CM | POA: Diagnosis not present

## 2020-09-11 DIAGNOSIS — M9905 Segmental and somatic dysfunction of pelvic region: Secondary | ICD-10-CM | POA: Diagnosis not present

## 2020-09-11 DIAGNOSIS — M9902 Segmental and somatic dysfunction of thoracic region: Secondary | ICD-10-CM | POA: Diagnosis not present

## 2020-09-11 DIAGNOSIS — M9903 Segmental and somatic dysfunction of lumbar region: Secondary | ICD-10-CM | POA: Diagnosis not present

## 2020-09-18 DIAGNOSIS — M9903 Segmental and somatic dysfunction of lumbar region: Secondary | ICD-10-CM | POA: Diagnosis not present

## 2020-09-18 DIAGNOSIS — M9901 Segmental and somatic dysfunction of cervical region: Secondary | ICD-10-CM | POA: Diagnosis not present

## 2020-09-18 DIAGNOSIS — M9902 Segmental and somatic dysfunction of thoracic region: Secondary | ICD-10-CM | POA: Diagnosis not present

## 2020-09-18 DIAGNOSIS — M9905 Segmental and somatic dysfunction of pelvic region: Secondary | ICD-10-CM | POA: Diagnosis not present

## 2020-09-20 DIAGNOSIS — M40292 Other kyphosis, cervical region: Secondary | ICD-10-CM | POA: Diagnosis not present

## 2020-09-20 DIAGNOSIS — M9902 Segmental and somatic dysfunction of thoracic region: Secondary | ICD-10-CM | POA: Diagnosis not present

## 2020-09-20 DIAGNOSIS — M9901 Segmental and somatic dysfunction of cervical region: Secondary | ICD-10-CM | POA: Diagnosis not present

## 2020-09-20 DIAGNOSIS — M9905 Segmental and somatic dysfunction of pelvic region: Secondary | ICD-10-CM | POA: Diagnosis not present

## 2020-10-18 DIAGNOSIS — M9901 Segmental and somatic dysfunction of cervical region: Secondary | ICD-10-CM | POA: Diagnosis not present

## 2020-10-18 DIAGNOSIS — M9902 Segmental and somatic dysfunction of thoracic region: Secondary | ICD-10-CM | POA: Diagnosis not present

## 2020-10-18 DIAGNOSIS — M9905 Segmental and somatic dysfunction of pelvic region: Secondary | ICD-10-CM | POA: Diagnosis not present

## 2020-10-18 DIAGNOSIS — M40292 Other kyphosis, cervical region: Secondary | ICD-10-CM | POA: Diagnosis not present

## 2020-10-20 DIAGNOSIS — E039 Hypothyroidism, unspecified: Secondary | ICD-10-CM | POA: Diagnosis not present

## 2020-12-15 DIAGNOSIS — T148XXA Other injury of unspecified body region, initial encounter: Secondary | ICD-10-CM | POA: Diagnosis not present

## 2020-12-15 DIAGNOSIS — E039 Hypothyroidism, unspecified: Secondary | ICD-10-CM | POA: Diagnosis not present

## 2021-01-12 DIAGNOSIS — Z131 Encounter for screening for diabetes mellitus: Secondary | ICD-10-CM | POA: Diagnosis not present

## 2021-01-12 DIAGNOSIS — Z0000 Encounter for general adult medical examination without abnormal findings: Secondary | ICD-10-CM | POA: Diagnosis not present

## 2021-01-12 DIAGNOSIS — E039 Hypothyroidism, unspecified: Secondary | ICD-10-CM | POA: Diagnosis not present

## 2021-01-12 DIAGNOSIS — Z13220 Encounter for screening for lipoid disorders: Secondary | ICD-10-CM | POA: Diagnosis not present

## 2021-01-12 DIAGNOSIS — Z6820 Body mass index (BMI) 20.0-20.9, adult: Secondary | ICD-10-CM | POA: Diagnosis not present

## 2021-01-12 DIAGNOSIS — Z01419 Encounter for gynecological examination (general) (routine) without abnormal findings: Secondary | ICD-10-CM | POA: Diagnosis not present

## 2021-07-06 DIAGNOSIS — E039 Hypothyroidism, unspecified: Secondary | ICD-10-CM | POA: Diagnosis not present

## 2021-07-20 ENCOUNTER — Other Ambulatory Visit: Payer: Self-pay | Admitting: Internal Medicine

## 2021-07-20 ENCOUNTER — Ambulatory Visit
Admission: RE | Admit: 2021-07-20 | Discharge: 2021-07-20 | Disposition: A | Discharge status: Home / Self Care | Payer: Federal, State, Local not specified - PPO | Source: Ambulatory Visit | Attending: Internal Medicine | Admitting: Internal Medicine

## 2021-07-20 DIAGNOSIS — Z1231 Encounter for screening mammogram for malignant neoplasm of breast: Secondary | ICD-10-CM | POA: Diagnosis not present

## 2021-07-20 DIAGNOSIS — R079 Chest pain, unspecified: Secondary | ICD-10-CM | POA: Diagnosis not present

## 2021-07-20 DIAGNOSIS — E039 Hypothyroidism, unspecified: Secondary | ICD-10-CM | POA: Diagnosis not present

## 2021-08-24 ENCOUNTER — Ambulatory Visit: Payer: Federal, State, Local not specified - PPO | Admitting: Cardiology

## 2021-08-31 ENCOUNTER — Ambulatory Visit: Payer: Federal, State, Local not specified - PPO | Admitting: Internal Medicine

## 2021-09-21 DIAGNOSIS — Q825 Congenital non-neoplastic nevus: Secondary | ICD-10-CM | POA: Diagnosis not present

## 2021-09-21 DIAGNOSIS — D2261 Melanocytic nevi of right upper limb, including shoulder: Secondary | ICD-10-CM | POA: Diagnosis not present

## 2021-09-21 DIAGNOSIS — L218 Other seborrheic dermatitis: Secondary | ICD-10-CM | POA: Diagnosis not present

## 2021-09-21 DIAGNOSIS — D2262 Melanocytic nevi of left upper limb, including shoulder: Secondary | ICD-10-CM | POA: Diagnosis not present

## 2021-10-05 ENCOUNTER — Other Ambulatory Visit: Payer: Self-pay

## 2021-10-05 ENCOUNTER — Encounter: Payer: Self-pay | Admitting: Internal Medicine

## 2021-10-05 ENCOUNTER — Ambulatory Visit: Payer: Federal, State, Local not specified - PPO | Admitting: Internal Medicine

## 2021-10-05 VITALS — BP 100/70 | HR 64 | Resp 20 | Ht 65.0 in | Wt 125.0 lb

## 2021-10-05 DIAGNOSIS — R0789 Other chest pain: Secondary | ICD-10-CM | POA: Diagnosis not present

## 2021-10-05 NOTE — Progress Notes (Signed)
OFFICE CONSULT NOTE  Chief Complaint:  Chest pressure  Primary Care Physician: Chesley Noon, MD  HPI:  Molly Castro is a 41 y.o. female who is being seen today for the evaluation of chest pressure at the request of Delrae Rend, MD. This is a pleasant 42 year old female who is kindly referred for evaluation of chest pressure.  She describes a sensation as squeezing that occurs sometimes with exertion like walking up a long incline but not consistently.  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  She denies any palpitations, no particular pain although she gets some occasional sharp chest twinges.  She had one episode when she was boogie boarding.  She also had had COVID-19 in 2021.  Only medical issues really related to hypothyroidism and she is on low-dose replacement.  She does have a daughter with congenital heart disease.  No one in the family has any significant coronary disease.  She has no significant coronary risk factors such as hypertension, diabetes or other traditional risks.  Her chest discomfort is not provoked with deep breathing or movement.  She denies any reflux symptoms although did have some GERD with pregnancy.  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 Pamala Hurry, MD;  Location: [facility];  Service: Gynecology;  Laterality: N/A;   CESAREAN SECTION N/A 10/30/2013   Procedure: Repeat CESAREAN SECTION;  Surgeon: Lovenia Kim, MD;  Location: [facility];  Service: Obstetrics;  Laterality: N/A;  EDD: 11/04/13    COLONOSCOPY  05-05-2017   dr Hilarie Fredrickson   WISDOM TOOTH EXTRACTION  2004    FAMHx:  Family History  Problem Relation Age of Onset   Colon cancer Mother 73   Colon cancer Maternal Grandmother    Pancreatic cancer Father    Pancreatic cancer Paternal Uncle     SOCHx:   reports that she has never smoked. She has never used smokeless tobacco. She reports current alcohol use of about 3.0 standard drinks per week. She reports that she does not use drugs.  ALLERGIES:  No Known Allergies  ROS: Pertinent items noted in HPI and remainder of comprehensive ROS otherwise negative.  HOME MEDS: Current Outpatient Medications on File Prior to Visit  Medication Sig Dispense Refill   Ascorbic Acid (VITAMIN C) 100 MG tablet Take 100 mg by mouth daily. Twice weekly     levothyroxine (SYNTHROID, LEVOTHROID) 75 MCG tablet Take 50 mcg by mouth daily before breakfast. Takes 50 mcg qod and 75 mcg qod     liothyronine (CYTOMEL) 5 MCG tablet Take 5 mcg by mouth daily.     magnesium gluconate (MAGONATE) 500 MG tablet Take 500 mg by mouth 2 (two) times daily.     Nutritional Supplements (JUICE PLUS FIBRE PO) Take 4 capsules by mouth daily.     zinc gluconate 50 MG tablet Take 50 mg by mouth daily. Twice weekly     No current facility-administered medications on file prior to visit.  LABS/IMAGING: No results found for this or any previous visit (from the past 48 hour(s)). No results found.  LIPID PANEL: No results found for: CHOL, TRIG, HDL, CHOLHDL, VLDL, LDLCALC, LDLDIRECT  WEIGHTS: Wt Readings from Last 3 Encounters:  10/05/21 125 lb (56.7 kg)  05/07/19 121 lb (54.9 kg)  07/03/18 121 lb (54.9 kg)    VITALS: BP 100/70 (BP Location: Left Arm, Patient Position: Sitting, Cuff Size: Normal)    Pulse 64    Resp 20    Ht 5\' 5"  (1.651 m)    Wt 125 lb (56.7 kg)    LMP 09/28/2021 (Exact Date)    BMI 20.80 kg/m   EXAM: General appearance: alert and no distress Neck: no carotid bruit, no JVD, and  thyroid not enlarged, symmetric, no tenderness/mass/nodules Lungs: clear to auscultation bilaterally Heart: regular rate and rhythm Abdomen: soft, non-tender; bowel sounds normal; no masses,  no organomegaly Extremities: extremities normal, atraumatic, no cyanosis or edema Pulses: 2+ and symmetric Skin: Skin color, texture, turgor normal. No rashes or lesions Neurologic: Grossly normal Psych: Pleasant  EKG: Normal sinus rhythm at 64- personally reviewed  ASSESSMENT: Noncardiac chest pain  PLAN: 1.   [name] is likely describing noncardiac chest pain.  I suspect this is a chest wall pain, could be a thoracic radiculopathy that is positional as she often bends over patients or does certain motions or causes sharp pain that feels like squeezing or wrapping around her chest.  This could be corrected possibly with improvement in posture.  Her symptoms are not always associated with exertion or relieved by rest.  Risk factors for heart disease are minimal.  I discussed the possibility of some further testing such as exercise treadmill testing, however she does exercise and is asymptomatic with that.  We could consider a calcium score or CT coronary angiography however my pretest probability for heart disease is low.  After presenting these options she would like to further monitor her symptoms and see if there is a pattern that she can determine.  She could also be having some silent reflux however I think that is less likely but could try over-the-counter famotidine.  I am more inclined to think that this is a chest wall pain perhaps neuropathic pain that may be related to position.  We will plan follow-up as needed.  Pixie Casino, MD, [facility], [place] Director of the [facility] Diplomate of the American Board of Clinical Lipidology Attending Cardiologist  Direct Dial: [phone]   Fax: [phone]   Website:  [email or web address] 10/05/2021, 4:46 PM

## 2021-10-05 NOTE — Patient Instructions (Signed)

## 2022-01-18 DIAGNOSIS — R232 Flushing: Secondary | ICD-10-CM | POA: Diagnosis not present

## 2022-01-18 DIAGNOSIS — E039 Hypothyroidism, unspecified: Secondary | ICD-10-CM | POA: Diagnosis not present

## 2022-01-18 DIAGNOSIS — F488 Other specified nonpsychotic mental disorders: Secondary | ICD-10-CM | POA: Diagnosis not present

## 2022-02-15 DIAGNOSIS — H15103 Unspecified episcleritis, bilateral: Secondary | ICD-10-CM | POA: Diagnosis not present

## 2022-04-01 DIAGNOSIS — Z6821 Body mass index (BMI) 21.0-21.9, adult: Secondary | ICD-10-CM | POA: Diagnosis not present

## 2022-04-01 DIAGNOSIS — Z01419 Encounter for gynecological examination (general) (routine) without abnormal findings: Secondary | ICD-10-CM | POA: Diagnosis not present

## 2022-04-01 DIAGNOSIS — Z113 Encounter for screening for infections with a predominantly sexual mode of transmission: Secondary | ICD-10-CM | POA: Diagnosis not present

## 2022-04-01 DIAGNOSIS — Z01411 Encounter for gynecological examination (general) (routine) with abnormal findings: Secondary | ICD-10-CM | POA: Diagnosis not present

## 2022-04-01 DIAGNOSIS — Z124 Encounter for screening for malignant neoplasm of cervix: Secondary | ICD-10-CM | POA: Diagnosis not present

## 2022-04-05 DIAGNOSIS — D2239 Melanocytic nevi of other parts of face: Secondary | ICD-10-CM | POA: Diagnosis not present

## 2022-04-05 DIAGNOSIS — D485 Neoplasm of uncertain behavior of skin: Secondary | ICD-10-CM | POA: Diagnosis not present

## 2022-04-12 ENCOUNTER — Telehealth: Payer: Self-pay | Admitting: Internal Medicine

## 2022-04-12 ENCOUNTER — Encounter: Payer: Self-pay | Admitting: Internal Medicine

## 2022-04-12 NOTE — Telephone Encounter (Signed)
Good Morning Dr. Rhea Belton,   Patient called stating she wanted to schedule her colonoscopy. Looking at patients chart she has 2 recalls in, one is for 05/05/2022 and the other is 11/15/2022. Is patient able to schedule now or does she need to wait? Please review  and advise.  Thank you!

## 2022-04-12 NOTE — Telephone Encounter (Signed)
LVM for patient to call back to schedule

## 2022-04-12 NOTE — Telephone Encounter (Signed)
Aug 2023 is correct for fam history Ok to schedule

## 2022-05-08 DIAGNOSIS — R140 Abdominal distension (gaseous): Secondary | ICD-10-CM | POA: Diagnosis not present

## 2022-05-31 DIAGNOSIS — D485 Neoplasm of uncertain behavior of skin: Secondary | ICD-10-CM | POA: Diagnosis not present

## 2022-05-31 DIAGNOSIS — L988 Other specified disorders of the skin and subcutaneous tissue: Secondary | ICD-10-CM | POA: Diagnosis not present

## 2022-06-14 ENCOUNTER — Ambulatory Visit: Payer: Federal, State, Local not specified - PPO

## 2022-06-14 VITALS — Ht 65.0 in | Wt 130.0 lb

## 2022-06-14 DIAGNOSIS — Z800 Family history of malignant neoplasm of digestive organs: Secondary | ICD-10-CM

## 2022-06-14 MED ORDER — NA SULFATE-K SULFATE-MG SULF 17.5-3.13-1.6 GM/177ML PO SOLN: 1.0000 | ORAL | 0 refills | Status: DC

## 2022-06-14 NOTE — Progress Notes (Signed)
No egg or soy allergy known to patient  No issues known to pt with past sedation with any surgeries or procedures Patient denies ever being told they had issues or difficulty with intubation  No FH of Malignant Hyperthermia Pt is not on diet pills Pt is not on  home 02  Pt is not on blood thinners  Pt denies issues with constipation  No A fib or A flutter Have any cardiac testing pending--denied Pt instructed to use [email or web address] or GoodRx for a price reduction on prep   

## 2022-07-12 ENCOUNTER — Encounter: Payer: Federal, State, Local not specified - PPO | Admitting: Internal Medicine

## 2022-07-26 DIAGNOSIS — E039 Hypothyroidism, unspecified: Secondary | ICD-10-CM | POA: Diagnosis not present

## 2022-07-26 DIAGNOSIS — D649 Anemia, unspecified: Secondary | ICD-10-CM | POA: Diagnosis not present

## 2022-08-30 ENCOUNTER — Ambulatory Visit (AMBULATORY_SURGERY_CENTER): Payer: Federal, State, Local not specified - PPO | Admitting: Internal Medicine

## 2022-08-30 ENCOUNTER — Encounter: Payer: Self-pay | Admitting: Internal Medicine

## 2022-08-30 VITALS — BP 111/60 | HR 68 | Temp 97.5°F | Resp 20 | Ht 65.0 in | Wt 130.0 lb

## 2022-08-30 DIAGNOSIS — Z800 Family history of malignant neoplasm of digestive organs: Secondary | ICD-10-CM | POA: Diagnosis not present

## 2022-08-30 DIAGNOSIS — Z1211 Encounter for screening for malignant neoplasm of colon: Secondary | ICD-10-CM | POA: Diagnosis not present

## 2022-08-30 LAB — POCT URINE PREGNANCY: Preg Test, Ur: NEGATIVE

## 2022-08-30 MED ORDER — SODIUM CHLORIDE 0.9 % IV SOLN: 500.0000 mL | Freq: Once | INTRAVENOUS | Status: DC

## 2022-08-30 NOTE — Patient Instructions (Signed)
YOU HAD AN ENDOSCOPIC PROCEDURE TODAY AT THE [facility]:   Refer to the procedure report that was given to you for any specific questions about what was found during the examination.  If the procedure report does not answer your questions, please call your gastroenterologist to clarify.  If you requested that your care partner not be given the details of your procedure findings, then the procedure report has been included in a sealed envelope for you to review at your convenience later.  YOU SHOULD EXPECT: Some feelings of bloating in the abdomen. Passage of more gas than usual.  Walking can help get rid of the air that was put into your GI tract during the procedure and reduce the bloating. If you had a lower endoscopy (such as a colonoscopy or flexible sigmoidoscopy) you may notice spotting of blood in your stool or on the toilet paper. If you underwent a bowel prep for your procedure, you may not have a normal bowel movement for a few days.  Please Note:  You might notice some irritation and congestion in your nose or some drainage.  This is from the oxygen used during your procedure.  There is no need for concern and it should clear up in a day or so.  SYMPTOMS TO REPORT IMMEDIATELY:  Following lower endoscopy (colonoscopy or flexible sigmoidoscopy):  Excessive amounts of blood in the stool  Significant tenderness or worsening of abdominal pains  Swelling of the abdomen that is new, acute  Fever of 100F or higher  For urgent or emergent issues, a gastroenterologist can be reached at any hour by calling [phone]. Do not use MyChart messaging for urgent concerns.    DIET:  We do recommend a small meal at first, but then you may proceed to your regular diet.  Drink plenty of fluids but you should avoid alcoholic beverages for 24 hours.  ACTIVITY:  You should plan to take it easy for the rest of today and you should NOT DRIVE or use heavy machinery until tomorrow (because of  the sedation medicines used during the test).    FOLLOW UP: Our staff will call the number listed on your records the next business day following your procedure.  We will call around 7:15- 8:00 am to check on you and address any questions or concerns that you may have regarding the information given to you following your procedure. If we do not reach you, we will leave a message.     If any biopsies were taken you will be contacted by phone or by letter within the next 1-3 weeks.  Please call us at [phone] if you have not heard about the biopsies in 3 weeks.    SIGNATURES/CONFIDENTIALITY: You and/or your care partner have signed paperwork which will be entered into your electronic medical record.  These signatures attest to the fact that that the information above on your After Visit Summary has been reviewed and is understood.  Full responsibility of the confidentiality of this discharge information lies with you and/or your care-partner.  

## 2022-08-30 NOTE — Progress Notes (Signed)
GASTROENTEROLOGY PROCEDURE H&P NOTE   Primary Care Physician: Chesley Noon, MD    Reason for Procedure:  Family history of colon cancer in mother  Plan:    Colonoscopy  Patient is appropriate for endoscopic procedure(s) in the ambulatory ([facility]) setting.  The nature of the procedure, as well as the risks, benefits, and alternatives were carefully and thoroughly reviewed with the patient. Ample time for discussion and questions allowed. The patient understood, was satisfied, and agreed to proceed.     HPI: Molly Castro is a 41 y.o. female who presents for screening colonoscopy.  Medical history as below.  Tolerated the prep.  No recent chest pain or shortness of breath.  No abdominal pain today.  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 Pamala Hurry, MD;  Location: [facility];  Service: Gynecology;  Laterality: N/A;   CESAREAN SECTION N/A 10/30/2013   Procedure: Repeat CESAREAN SECTION;  Surgeon: Lovenia Kim, MD;  Location: [facility];  Service: Obstetrics;  Laterality: N/A;  EDD: 11/04/13   COLONOSCOPY  05-05-2017   dr Hilarie Fredrickson   WISDOM TOOTH EXTRACTION  2004    Prior to Admission medications   Medication Sig Start Date End Date Taking? Authorizing Provider  levothyroxine (SYNTHROID, LEVOTHROID) 75 MCG tablet Take 50 mcg by mouth daily before breakfast. Take 1 tablet qod and 1.5 tablet qod   Yes [provider]  liothyronine (CYTOMEL) 5 MCG tablet Take 5 mcg by mouth daily.   Yes [provider]  magnesium gluconate (MAGONATE) 500 MG tablet Take 500 mg by mouth 2 (two) times daily.   Yes [provider]  Na Sulfate-K Sulfate-Mg  Sulf 17.5-3.13-1.6 GM/177ML SOLN Take 1 kit by mouth as directed. May use generic Suprep, no prior authorization. Take as directed. 06/14/22  Yes Shakeila Pfarr, Lajuan Lines, MD  zinc gluconate 50 MG tablet Take 50 mg by mouth daily. Twice weekly   Yes [provider]  Ascorbic Acid (VITAMIN C) 100 MG tablet Take 100 mg by mouth daily. Twice weekly Patient not taking: Reported on 06/14/2022    [provider]  Nutritional Supplements (JUICE PLUS FIBRE PO) Take 4 capsules by mouth daily.    [provider]    Current Outpatient Medications  Medication Sig Dispense Refill   levothyroxine (SYNTHROID, LEVOTHROID) 75 MCG tablet Take 50 mcg by mouth daily before breakfast. Take 1 tablet qod and 1.5 tablet qod     liothyronine (CYTOMEL) 5 MCG tablet Take 5 mcg by mouth daily.     magnesium gluconate (MAGONATE) 500 MG tablet Take 500 mg by mouth 2 (two) times daily.     Na Sulfate-K Sulfate-Mg Sulf 17.5-3.13-1.6 GM/177ML SOLN Take 1 kit by mouth as directed. May use generic Suprep, no prior authorization. Take as directed. 354 mL 0   zinc gluconate 50 MG tablet Take 50 mg by mouth daily. Twice weekly     Ascorbic Acid (VITAMIN C) 100 MG tablet Take 100 mg by mouth daily. Twice weekly (Patient not taking: Reported on 06/14/2022)     Nutritional Supplements (JUICE PLUS FIBRE PO) Take 4 capsules  by mouth daily.     Current Facility-Administered Medications  Medication Dose Route Frequency Provider Last Rate Last Admin   0.9 %  sodium chloride infusion  500 mL Intravenous Once Kerri Asche, Lajuan Lines, MD        Allergies as of 08/30/2022   (No Known Allergies)    Family History  Problem Relation Age of Onset   Rectal cancer Mother    Colon cancer Mother 3   Pancreatic cancer Father    Esophageal cancer Maternal Uncle    Stomach cancer Maternal Uncle    Pancreatic cancer Paternal Uncle    Colon cancer Maternal Grandmother     Social History   Socioeconomic History   Marital status:  Married    Spouse name: Not on file   Number of children: Not on file   Years of education: Not on file   Highest education level: Not on file  Occupational History   Not on file  Tobacco Use   Smoking status: Never   Smokeless tobacco: Never  Vaping Use   Vaping Use: Never used  Substance and Sexual Activity   Alcohol use: Yes    Alcohol/week: 3.0 standard drinks of alcohol    Types: 3 Cans of beer per week    Comment: socially   Drug use: No   Sexual activity: Yes  Other Topics Concern   Not on file  Social History Narrative   Not on file   Social Determinants of Health   Financial Resource Strain: Not on file  Food Insecurity: Not on file  Transportation Needs: Not on file  Physical Activity: Not on file  Stress: Not on file  Social Connections: Not on file  Intimate Partner Violence: Not on file    Physical Exam: Vital signs in last 24 hours: _0  109/68   Pulse 74   Temp (!) 97.5 F (36.4 C)   Ht _1  (1.651 m)   Wt 130 lb (59 kg)   SpO2 99%   BMI 21.63 kg/m  GEN: NAD EYE: Sclerae anicteric ENT: MMM CV: Non-tachycardic Pulm: CTA b/l GI: Soft, NT/ND NEURO:  Alert & Oriented x 3   Molly Jarred, MD [facility]  08/30/2022 8:23 AM

## 2022-08-30 NOTE — Progress Notes (Signed)
VS by DT  Pt's states no medical or surgical changes since previsit or office visit.  

## 2022-08-30 NOTE — Op Note (Signed)
[facility] Patient Name: Molly Castro Procedure Date: 08/30/2022 8:26 AM MRN: [id number] Endoscopist: Jerene Bears , MD, [id number] Age: 41 Referring MD:  Date of Birth: 1980-11-18 Gender: Female Account #: [id number] Procedure:                Colonoscopy Indications:              Screening in patient at increased risk: Family                            history of 1st-degree relative with colorectal                            cancer (mother), Last colonoscopy: August 2018 Medicines:                Monitored Anesthesia Care Procedure:                Pre-Anesthesia Assessment:                           - Prior to the procedure, a History and Physical                            was performed, and patient medications and                            allergies were reviewed. The patient's tolerance of                            previous anesthesia was also reviewed. The risks                            and benefits of the procedure and the sedation                            options and risks were discussed with the patient.                            All questions were answered, and informed consent                            was obtained. Prior Anticoagulants: The patient has                            taken no anticoagulant or antiplatelet agents. ASA                            Grade Assessment: II - A patient with mild systemic                            disease. After reviewing the risks and benefits,                            the patient was deemed in satisfactory condition to  undergo the procedure.                           After obtaining informed consent, the colonoscope                            was passed under direct vision. Throughout the                            procedure, the patient's blood pressure, pulse, and                            oxygen saturations were monitored continuously. The                            [id number]-HQ190L  Colonoscope was introduced through the                            anus and advanced to the terminal ileum. The                            colonoscopy was performed without difficulty. The                            patient tolerated the procedure well. The quality                            of the bowel preparation was excellent. The                            terminal ileum, ileocecal valve, appendiceal                            orifice, and rectum were photographed. Scope In: 8:38:52 AM Scope Out: 8:56:20 AM Scope Withdrawal Time: 0 hours 11 minutes 12 seconds  Total Procedure Duration: 0 hours 17 minutes 28 seconds  Findings:                 The digital rectal exam was normal.                           The terminal ileum appeared normal.                           The entire examined colon appeared normal on direct                            and retroflexion views. Complications:            No immediate complications. Estimated Blood Loss:     Estimated blood loss: none. Impression:               - The examined portion of the ileum was normal.                           - The entire examined colon is normal on  direct and                            retroflexion views.                           - No specimens collected. Recommendation:           - Patient has a contact number available for                            emergencies. The signs and symptoms of potential                            delayed complications were discussed with the                            patient. Return to normal activities tomorrow.                            Written discharge instructions were provided to the                            patient.                           - Resume previous diet.                           - Continue present medications.                           - Repeat colonoscopy in 5 years for screening                            purposes. Beverley Fiedler, MD 08/30/2022 9:06:20 AM This report  has been signed electronically.

## 2022-08-30 NOTE — Progress Notes (Signed)
Sedate, gd SR, tolerated procedure well, VSS, report to RN 

## 2022-09-02 ENCOUNTER — Telehealth: Payer: Self-pay

## 2022-09-02 NOTE — Telephone Encounter (Signed)
Left message on answering machine. 

## 2022-10-25 DIAGNOSIS — M25551 Pain in right hip: Secondary | ICD-10-CM | POA: Diagnosis not present

## 2022-10-25 DIAGNOSIS — S76311A Strain of muscle, fascia and tendon of the posterior muscle group at thigh level, right thigh, initial encounter: Secondary | ICD-10-CM | POA: Diagnosis not present

## 2022-11-15 DIAGNOSIS — M25551 Pain in right hip: Secondary | ICD-10-CM | POA: Diagnosis not present

## 2022-12-06 DIAGNOSIS — R1032 Left lower quadrant pain: Secondary | ICD-10-CM | POA: Diagnosis not present

## 2022-12-16 ENCOUNTER — Other Ambulatory Visit: Payer: Self-pay | Admitting: Surgery

## 2022-12-16 DIAGNOSIS — R1032 Left lower quadrant pain: Secondary | ICD-10-CM

## 2023-01-17 DIAGNOSIS — E039 Hypothyroidism, unspecified: Secondary | ICD-10-CM | POA: Diagnosis not present

## 2023-01-31 ENCOUNTER — Other Ambulatory Visit: Payer: Federal, State, Local not specified - PPO

## 2023-04-23 ENCOUNTER — Emergency Department (HOSPITAL_BASED_OUTPATIENT_CLINIC_OR_DEPARTMENT_OTHER)
Admission: EM | Admit: 2023-04-23 | Discharge: 2023-04-23 | Disposition: A | Discharge status: Home / Self Care | Payer: Federal, State, Local not specified - PPO | Attending: Emergency Medicine | Admitting: Emergency Medicine

## 2023-04-23 ENCOUNTER — Encounter (HOSPITAL_BASED_OUTPATIENT_CLINIC_OR_DEPARTMENT_OTHER): Payer: Self-pay

## 2023-04-23 ENCOUNTER — Emergency Department (HOSPITAL_BASED_OUTPATIENT_CLINIC_OR_DEPARTMENT_OTHER): Payer: Federal, State, Local not specified - PPO

## 2023-04-23 ENCOUNTER — Other Ambulatory Visit: Payer: Self-pay

## 2023-04-23 DIAGNOSIS — S0083XA Contusion of other part of head, initial encounter: Secondary | ICD-10-CM | POA: Diagnosis not present

## 2023-04-23 DIAGNOSIS — E039 Hypothyroidism, unspecified: Secondary | ICD-10-CM | POA: Insufficient documentation

## 2023-04-23 DIAGNOSIS — S0993XA Unspecified injury of face, initial encounter: Secondary | ICD-10-CM | POA: Diagnosis present

## 2023-04-23 DIAGNOSIS — Z79890 Hormone replacement therapy (postmenopausal): Secondary | ICD-10-CM | POA: Insufficient documentation

## 2023-04-23 MED ORDER — KETOROLAC TROMETHAMINE 30 MG/ML IJ SOLN
30.0000 mg | Freq: Once | INTRAMUSCULAR | Status: AC
  Administered 2023-04-23: 30 mg via INTRAVENOUS
  Filled 2023-04-23: qty 1

## 2023-04-23 MED ORDER — SODIUM CHLORIDE 0.9 % IV BOLUS
1000.0000 mL | Freq: Once | INTRAVENOUS | Status: AC
  Administered 2023-04-23: 1000 mL via INTRAVENOUS

## 2023-04-23 NOTE — ED Notes (Signed)
Patient transported to CT 
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
## 2023-04-23 NOTE — ED Notes (Signed)
 RN reviewed discharge instructions with pt. Pt verbalized understanding and had no further questions. VSS upon discharge.  
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
## 2023-04-29 ENCOUNTER — Encounter: Payer: Federal, State, Local not specified - PPO | Admitting: Sports Medicine
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
## 2023-04-30 ENCOUNTER — Ambulatory Visit: Payer: Federal, State, Local not specified - PPO | Admitting: Sports Medicine

## 2023-04-30 ENCOUNTER — Encounter: Payer: Federal, State, Local not specified - PPO | Admitting: Sports Medicine

## 2023-04-30 VITALS — BP 124/82 | HR 81 | Ht 65.0 in | Wt 121.0 lb

## 2023-04-30 DIAGNOSIS — S060X0A Concussion without loss of consciousness, initial encounter: Secondary | ICD-10-CM | POA: Diagnosis not present

## 2023-04-30 NOTE — Patient Instructions (Addendum)
-   Relative mental and physical rest for 48 hours after concussive event -Recommend light aerobic activity while keeping symptoms less than 3/10 -Stop mental or physical activities that cause symptoms to worsen greater than 3/10, and wait 24 hours before attempting them again -Eliminate screen time as much as possible for first 48 hours after concussive event, then continue limited screen time (recommend less than 2 hours per day) Work note provided  Recommend melatonin up to 5 mg with goal of 7-8 hours of sleep a night  Neck HEP  1 week follow up
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
## 2023-05-07 ENCOUNTER — Encounter: Payer: Federal, State, Local not specified - PPO | Admitting: Sports Medicine

## 2023-09-05 IMAGING — DX DG CHEST 2V
2 series · 2 of 2 positions shown · non-contrast
Comparison: None.

CLINICAL DATA: Chest pain, unspecified type

EXAM:
CHEST - 2 VIEW

[dg chest 2 view (1 of 2)]
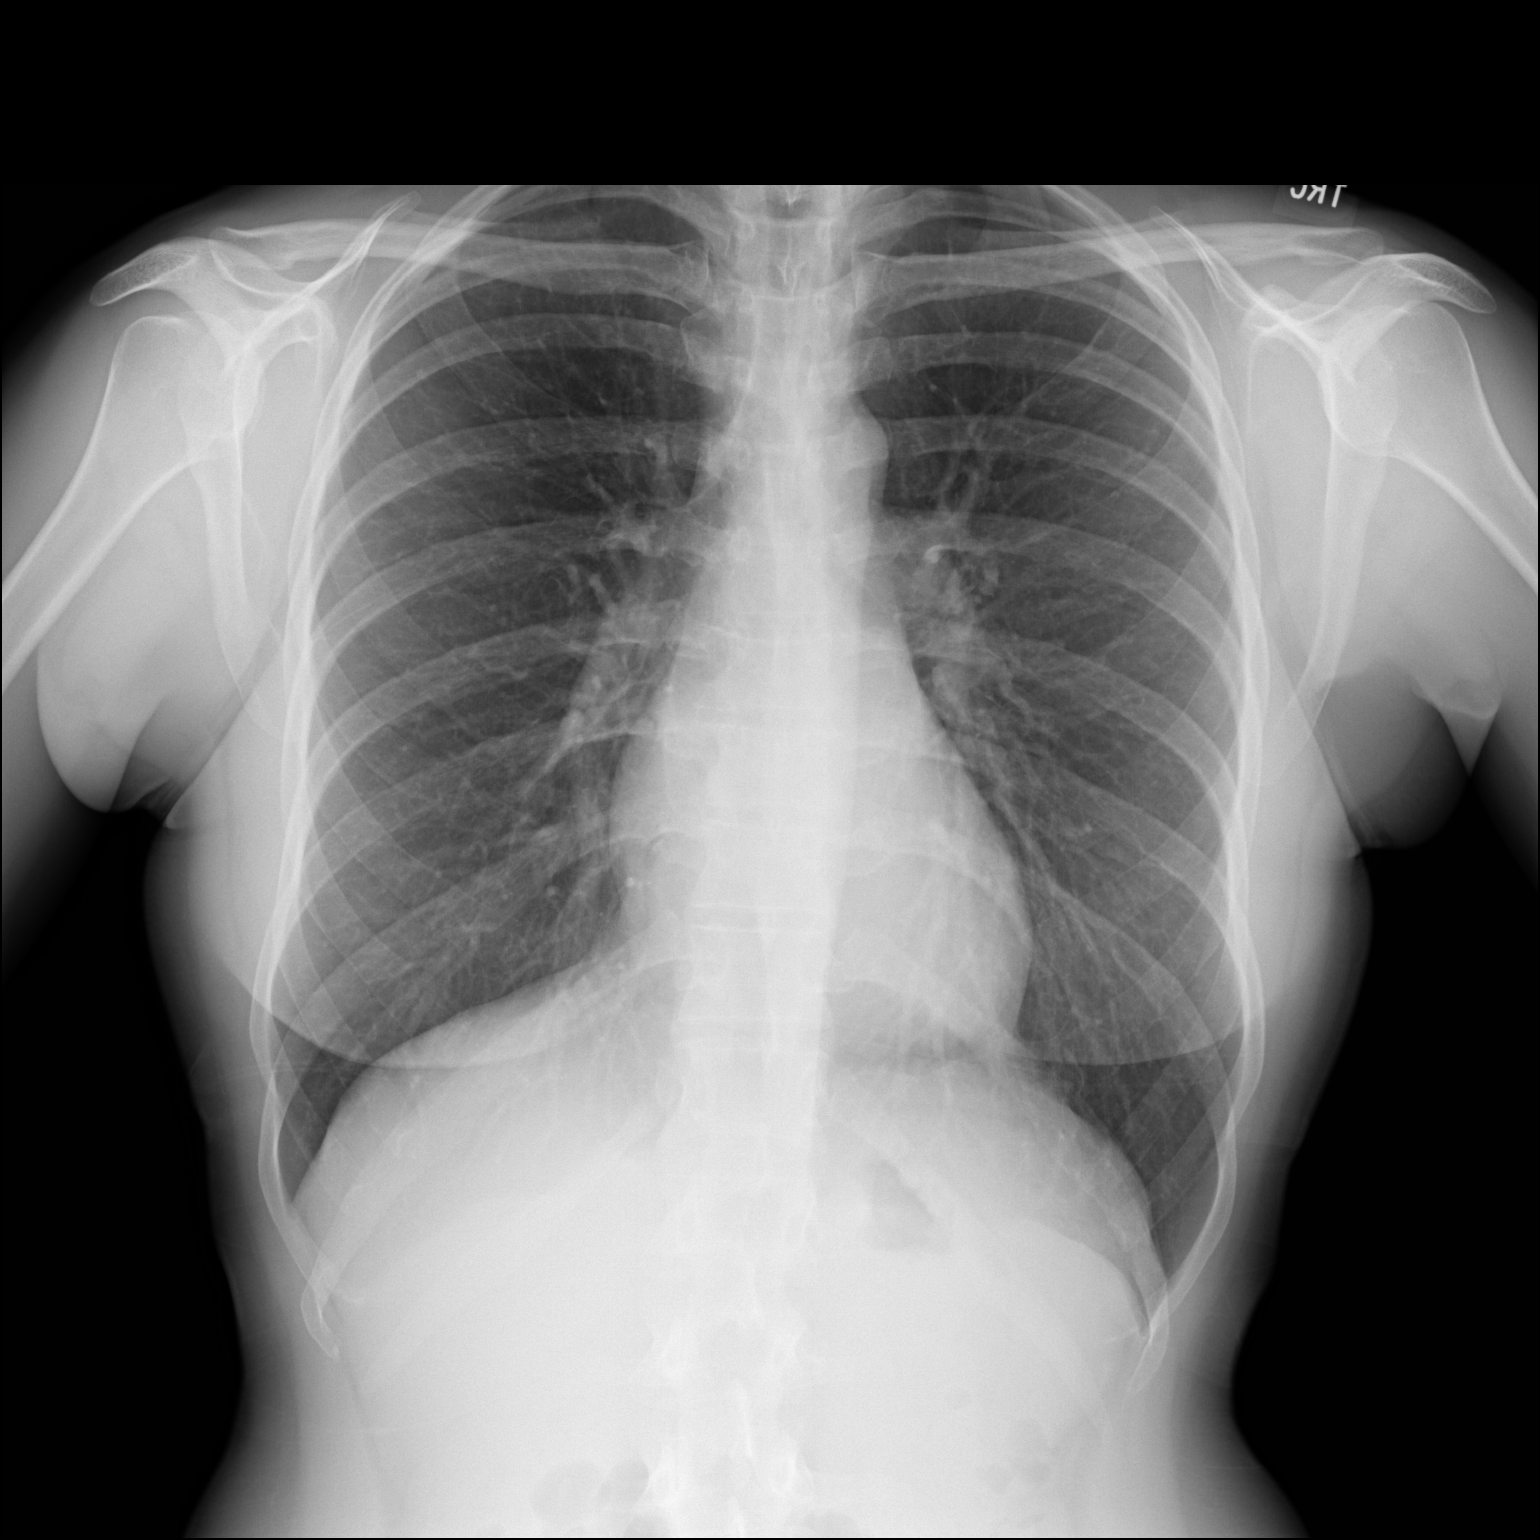

[dg chest 2 view (2 of 2)]
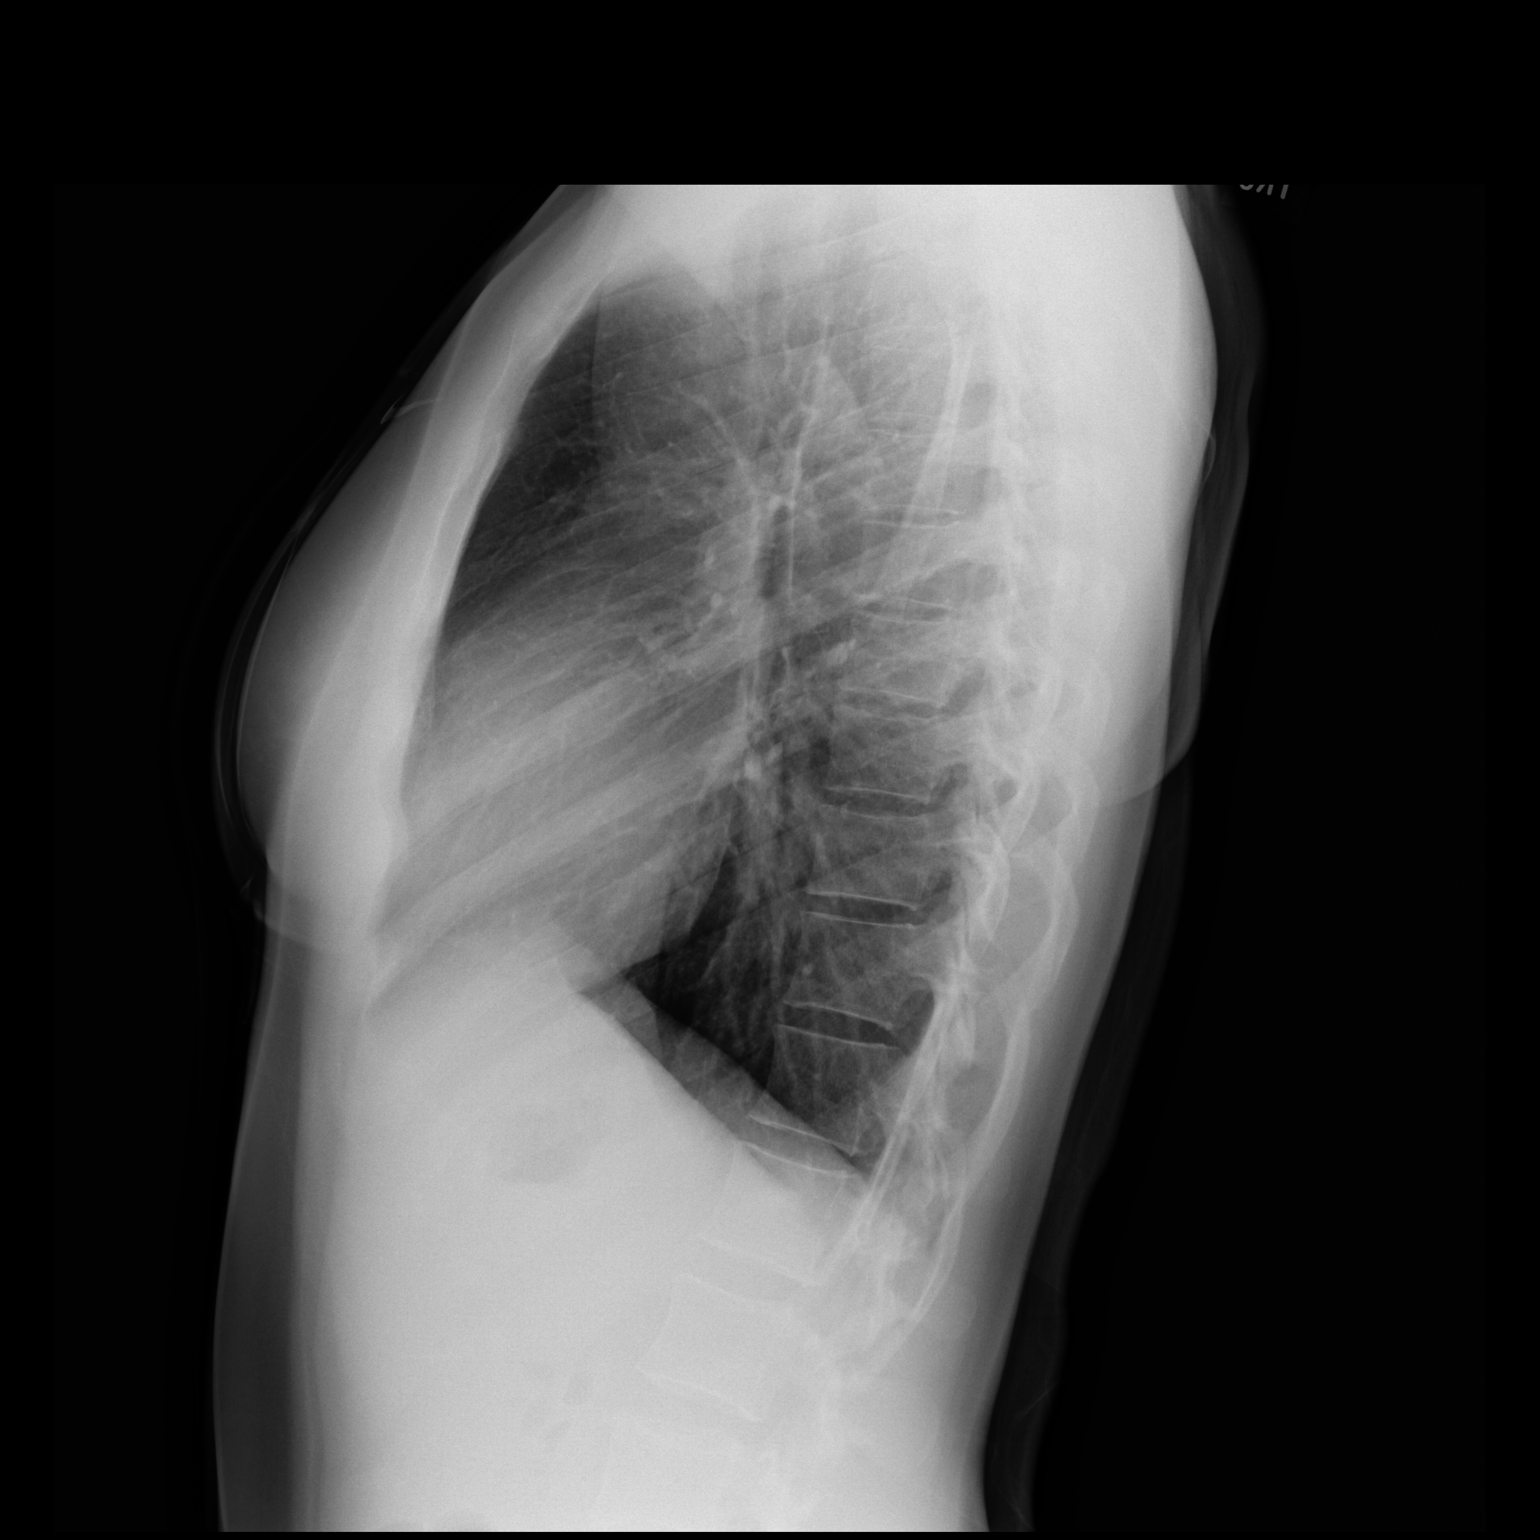

[2 of 2 positions shown; findings below may reference images not displayed]

FINDINGS: The cardiomediastinal silhouette is within normal limits. No pleural
effusion. No pneumothorax. No mass or consolidation. No acute
osseous abnormality.
IMPRESSION: No acute findings in the chest.

## 2024-10-22 ENCOUNTER — Encounter: Payer: Self-pay | Admitting: Radiology

## 2024-10-22 ENCOUNTER — Ambulatory Visit (INDEPENDENT_AMBULATORY_CARE_PROVIDER_SITE_OTHER): Admitting: Radiology

## 2024-10-22 VITALS — BP 108/70 | HR 72 | Ht 65.0 in | Wt 127.0 lb

## 2024-10-22 DIAGNOSIS — N951 Menopausal and female climacteric states: Secondary | ICD-10-CM

## 2024-10-22 DIAGNOSIS — Z1331 Encounter for screening for depression: Secondary | ICD-10-CM

## 2024-10-22 DIAGNOSIS — Z01419 Encounter for gynecological examination (general) (routine) without abnormal findings: Secondary | ICD-10-CM

## 2024-10-22 NOTE — Patient Instructions (Signed)
 Preventive Care 58-43 Years Old, Female  Preventive care refers to lifestyle choices and visits with your health care provider that can promote health and wellness. Preventive care visits are also called wellness exams.  What can I expect for my preventive care visit?  Counseling  Your health care provider may ask you questions about your:  Medical history, including:  Past medical problems.  Family medical history.  Pregnancy history.  Current health, including:  Menstrual cycle.  Method of birth control.  Emotional well-being.  Home life and relationship well-being.  Sexual activity and sexual health.  Lifestyle, including:  Alcohol, nicotine or tobacco, and drug use.  Access to firearms.  Diet, exercise, and sleep habits.  Work and work Astronomer.  Sunscreen use.  Safety issues such as seatbelt and bike helmet use.  Physical exam  Your health care provider will check your:  Height and weight. These may be used to calculate your BMI (body mass index). BMI is a measurement that tells if you are at a healthy weight.  Waist circumference. This measures the distance around your waistline. This measurement also tells if you are at a healthy weight and may help predict your risk of certain diseases, such as type 2 diabetes and high blood pressure.  Heart rate and blood pressure.  Body temperature.  Skin for abnormal spots.  What immunizations do I need?    Vaccines are usually given at various ages, according to a schedule. Your health care provider will recommend vaccines for you based on your age, medical history, and lifestyle or other factors, such as travel or where you work.  What tests do I need?  Screening  Your health care provider may recommend screening tests for certain conditions. This may include:  Lipid and cholesterol levels.  Diabetes screening. This is done by checking your blood sugar (glucose) after you have not eaten for a while (fasting).  Pelvic exam and Pap test.  Hepatitis B test.  Hepatitis C  test.  HIV (human immunodeficiency virus) test.  STI (sexually transmitted infection) testing, if you are at risk.  Lung cancer screening.  Colorectal cancer screening.  Mammogram. Talk with your health care provider about when you should start having regular mammograms. This may depend on whether you have a family history of breast cancer.  BRCA-related cancer screening. This may be done if you have a family history of breast, ovarian, tubal, or peritoneal cancers.  Bone density scan. This is done to screen for osteoporosis.  Talk with your health care provider about your test results, treatment options, and if necessary, the need for more tests.  Follow these instructions at home:  Eating and drinking    Eat a diet that includes fresh fruits and vegetables, whole grains, lean protein, and low-fat dairy products.  Take vitamin and mineral supplements as recommended by your health care provider.  Do not drink alcohol if:  Your health care provider tells you not to drink.  You are pregnant, may be pregnant, or are planning to become pregnant.  If you drink alcohol:  Limit how much you have to 0-1 drink a day.  Know how much alcohol is in your drink. In the [place] one drink equals one 12 oz bottle of beer (355 mL), one 5 oz glass of wine (148 mL), or one 1 oz glass of hard liquor (44 mL).  Lifestyle  Brush your teeth every morning and night with fluoride toothpaste. Floss one time each day.  Exercise for at least  30 minutes 5 or more days each week.  Do not use any products that contain nicotine or tobacco. These products include cigarettes, chewing tobacco, and vaping devices, such as e-cigarettes. If you need help quitting, ask your health care provider.  Do not use drugs.  If you are sexually active, practice safe sex. Use a condom or other form of protection to prevent STIs.  If you do not wish to become pregnant, use a form of birth control. If you plan to become pregnant, see your health care provider for a  prepregnancy visit.  Take aspirin only as told by your health care provider. Make sure that you understand how much to take and what form to take. Work with your health care provider to find out whether it is safe and beneficial for you to take aspirin daily.  Find healthy ways to manage stress, such as:  Meditation, yoga, or listening to music.  Journaling.  Talking to a trusted person.  Spending time with friends and family.  Minimize exposure to UV radiation to reduce your risk of skin cancer.  Safety  Always wear your seat belt while driving or riding in a vehicle.  Do not drive:  If you have been drinking alcohol. Do not ride with someone who has been drinking.  When you are tired or distracted.  While texting.  If you have been using any mind-altering substances or drugs.  Wear a helmet and other protective equipment during sports activities.  If you have firearms in your house, make sure you follow all gun safety procedures.  Seek help if you have been physically or sexually abused.  What's next?  Visit your health care provider once a year for an annual wellness visit.  Ask your health care provider how often you should have your eyes and teeth checked.  Stay up to date on all vaccines.  This information is not intended to replace advice given to you by your health care provider. Make sure you discuss any questions you have with your health care provider.  Document Revised: 02/28/2021 Document Reviewed: 02/28/2021  Elsevier Patient Education  2024 [employer].

## 2024-10-22 NOTE — Progress Notes (Signed)
 "  Molly Castro Oct 14, 1980 [id number]   History:  44 y.o. G2P2 presents for annual exam as a new patient, transfer from [facility].  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 Went years without a period after having her children and then they came back on their own in 2020. Can we irregular but does not skip full months. Hypothyroid managed by endo with levothyroxine and cytomel. She c/o brain fog, forgetfulness, fatigue, waking in the middle of the night, increased belly fat, elevated LDL.   Gynecologic History Patient's last menstrual period was 09/13/2024 (approximate). Period Cycle (Days):  (does not track periods, 30-40) Period Duration (Days): 3 Period Pattern: Regular Menstrual Flow: Moderate, Light Menstrual Control: Tampon (menstrual cup) Dysmenorrhea: (!) Moderate Dysmenorrhea Symptoms: Other (Comment), Cramping (lower back cramping) Contraception/Family planning: vasectomy Sexually active: yes Last Pap: 2023. Results were: normal Last mammogram: 1/26. Results were: normal with dense breasts  Obstetric History OB History  Gravida Para Term Preterm AB Living  2 2 2   2   SAB IAB Ectopic Multiple Live Births      2    # Outcome Date GA Lbr Len/2nd Weight Sex Type Anes PTL Lv  2 Term 10/30/13 [redacted]w[redacted]d  8 lb 0.2 oz (3.634 kg) F CS-LTranv Spinal  LIV  1 Term 12/06/11 [redacted]w[redacted]d 02:53 / 04:46 8 lb 1.8 oz (3.68 kg) M CS-Vac EPI  LIV     Birth Comments: CPD       10/22/2024   10:03 AM  Depression screen PHQ 2/9  Decreased Interest 0  Down, Depressed, Hopeless 0  PHQ - 2 Score 0     The following portions of the patient's history were reviewed and updated as appropriate: allergies, current medications, past family history, past medical history, past social history, past surgical history, and problem list.  Review of Systems  All other systems reviewed and are negative.   Past medical history, past  surgical history, family history and social history were all reviewed and documented in the EPIC chart.  Exam:  Vitals:   10/22/24 1001  BP: 108/70  Pulse: 72  SpO2: 99%  Weight: 127 lb (57.6 kg)  Height: 5' 5 (1.651 m)   Body mass index is 21.13 kg/m.  Physical Exam Vitals and nursing note reviewed. Exam conducted with a chaperone present.  Constitutional:      Appearance: Normal appearance. She is normal weight.  HENT:     Head: Normocephalic and atraumatic.  Cardiovascular:     Rate and Rhythm: Regular rhythm.     Heart sounds: Normal heart sounds.  Pulmonary:     Effort: Pulmonary effort is normal.     Breath sounds: Normal breath sounds.  Chest:  Breasts:    Breasts are symmetrical.     Right: Normal. No inverted nipple, mass, nipple discharge, skin change or tenderness.     Left: Normal. No inverted nipple, mass, nipple discharge, skin change or tenderness.  Abdominal:     General: Abdomen is flat. Bowel sounds are normal.     Palpations: Abdomen is soft.  Genitourinary:    General: Normal vulva.     Vagina: Normal. No vaginal discharge, bleeding or lesions.     Cervix: Normal. No discharge or lesion.     Uterus: Normal. Not enlarged and not tender.      Adnexa: Right adnexa normal and left adnexa normal.       Right: No mass,  tenderness or fullness.         Left: No mass, tenderness or fullness.    Lymphadenopathy:     Upper Body:     Right upper body: No axillary adenopathy.     Left upper body: No axillary adenopathy.  Skin:    General: Skin is warm and dry.  Neurological:     Mental Status: She is alert and oriented to person, place, and time.  Psychiatric:        Mood and Affect: Mood normal.        Thought Content: Thought content normal.        Judgment: Judgment normal.      Darice Hoit, CMA present for exam  Assessment/Plan:   1. Well woman exam with routine gynecological exam (Primary) - Cytology - PAP( [facility])  2. Perimenopausal  symptoms - Estradiol , Ultra Sens - Testos,Total,Free and SHBG (Female)  3. Depression screening    Vitamin D was 32 at PCP in August.  Return in about 1 year (around 10/22/2025) for Annual.  Prentiss Hammett B WHNP-BC 11:07 AM 10/22/2024 "
